# Patient Record
Sex: Female | Born: 1990 | Hispanic: Yes | Marital: Single | Smoking: Never smoker
Health system: Southern US, Community
[De-identification: ages and names within clinical notes are randomized; demographics above are authoritative.]

## PROBLEM LIST (undated history)

## (undated) ENCOUNTER — Inpatient Hospital Stay: Payer: Self-pay

## (undated) DIAGNOSIS — N6019 Diffuse cystic mastopathy of unspecified breast: Secondary | ICD-10-CM

## (undated) HISTORY — PX: NO PAST SURGERIES: SHX2092

---
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
## 2011-02-11 ENCOUNTER — Emergency Department: Payer: Self-pay | Admitting: Emergency Medicine

## 2011-12-09 ENCOUNTER — Observation Stay: Payer: Self-pay | Admitting: Unknown Physician Specialty

## 2011-12-14 ENCOUNTER — Observation Stay: Payer: Self-pay | Admitting: Obstetrics and Gynecology

## 2011-12-17 ENCOUNTER — Inpatient Hospital Stay: Payer: Self-pay | Admitting: Obstetrics and Gynecology

## 2011-12-17 LAB — CBC WITH DIFFERENTIAL/PLATELET
Eosinophil %: 0.2 %
HCT: 38.8 % (ref 35.0–47.0)
Lymphocyte #: 1.7 10*3/uL (ref 1.0–3.6)
MCHC: 34.6 g/dL (ref 32.0–36.0)
MCV: 95 fL (ref 80–100)
Monocyte #: 0.7 x10 3/mm (ref 0.2–0.9)
Monocyte %: 5.8 %
Neutrophil #: 9.1 10*3/uL — ABNORMAL HIGH (ref 1.4–6.5)
Neutrophil %: 79.5 %
Platelet: 140 10*3/uL — ABNORMAL LOW (ref 150–440)
RBC: 4.08 10*6/uL (ref 3.80–5.20)
RDW: 12.6 % (ref 11.5–14.5)
WBC: 11.5 10*3/uL — ABNORMAL HIGH (ref 3.6–11.0)

## 2011-12-19 LAB — HEMATOCRIT: HCT: 29.9 % — ABNORMAL LOW (ref 35.0–47.0)

## 2011-12-29 ENCOUNTER — Inpatient Hospital Stay (HOSPITAL_COMMUNITY): Admission: AD | Admit: 2011-12-29 | Payer: Self-pay | Source: Ambulatory Visit | Admitting: Family Medicine

## 2012-07-21 IMAGING — US US OB < 14 WEEKS - US OB TV
1 series · 17 of 28 positions shown · non-contrast
Comparison: none

REASON FOR EXAM: pregnant,vaginal bleeding
COMMENTS:

[Series 1: us ob < 14 weeks - us ob tv · 17 of 99 slices shown]
[im 1/99]
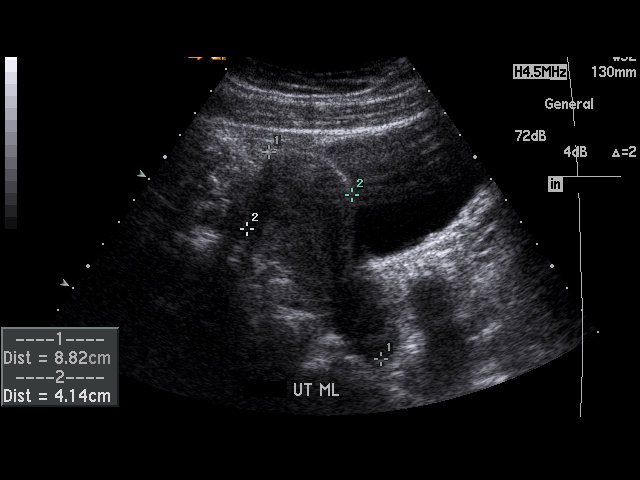
[im 8/99]
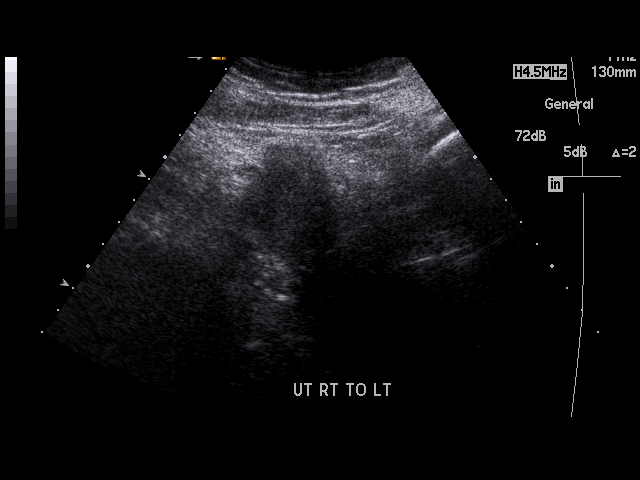
[im 15/99]
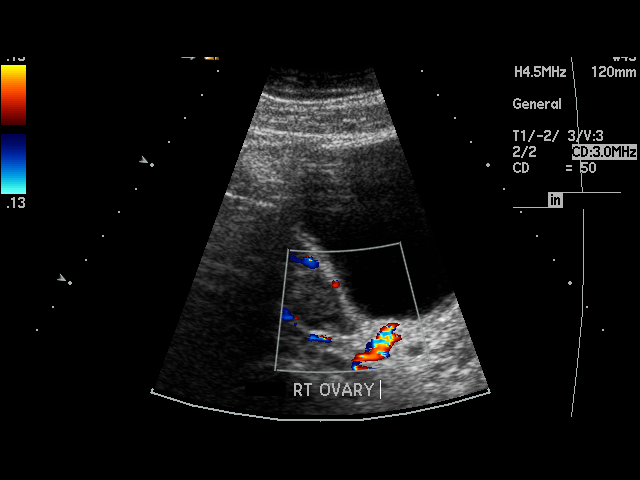
[im 19/99]
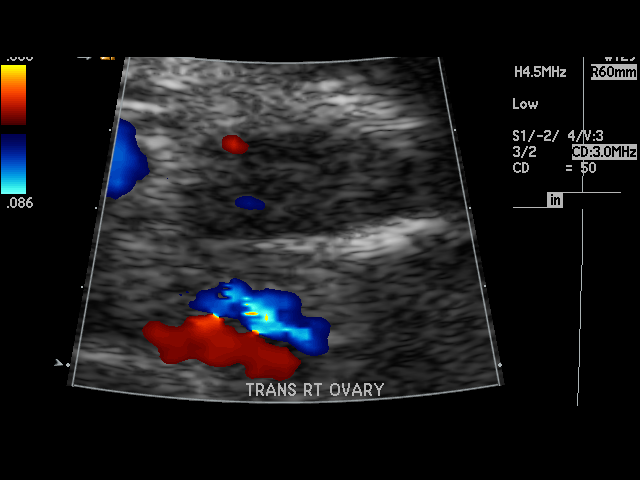
[im 26/99]
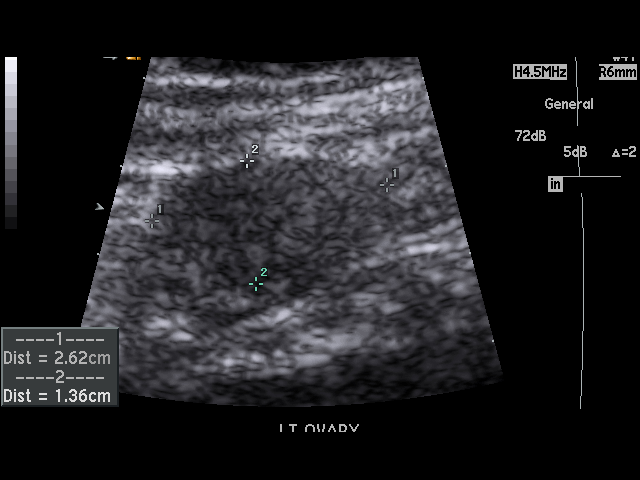
[im 33/99]
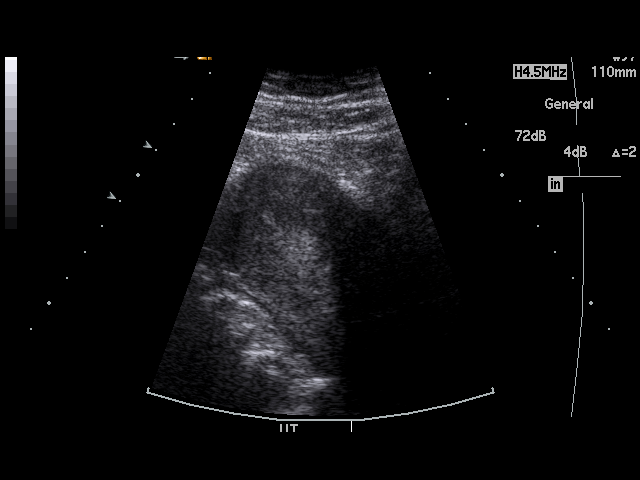
[im 37/99]
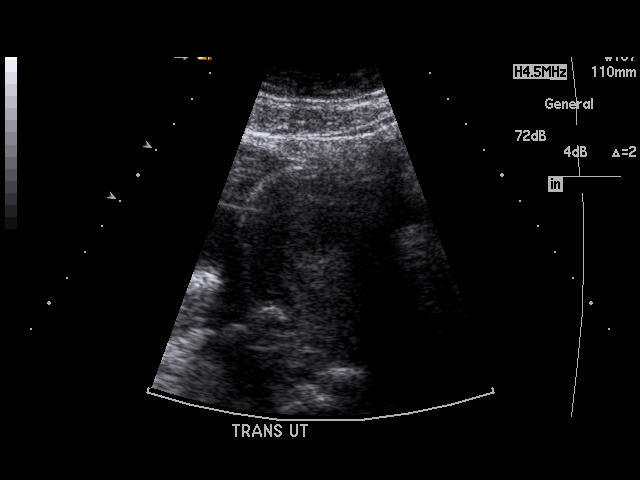
[im 44/99]
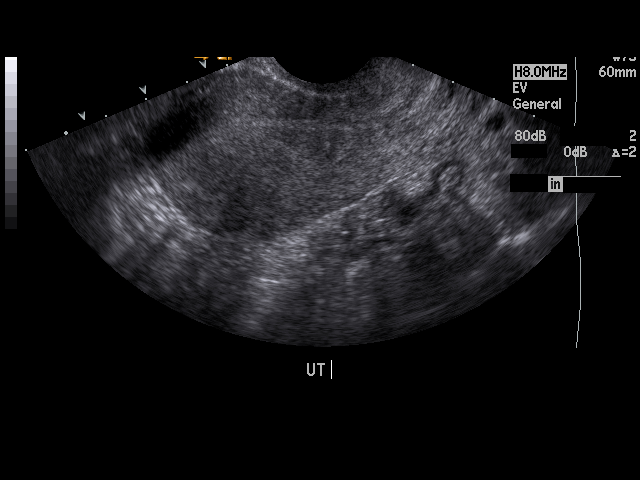
[im 51/99]
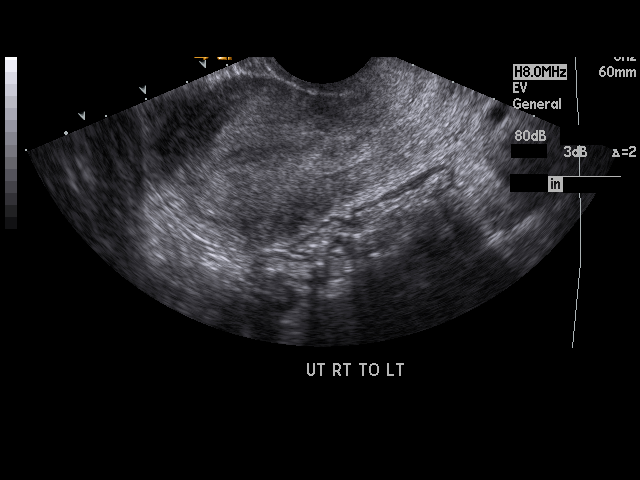
[im 55/99]
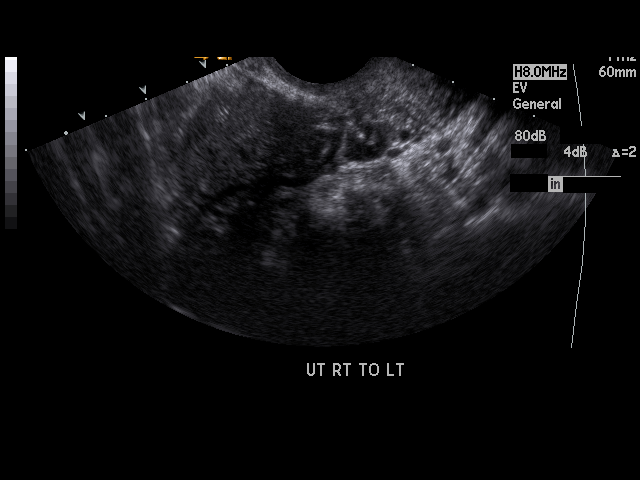
[im 62/99]
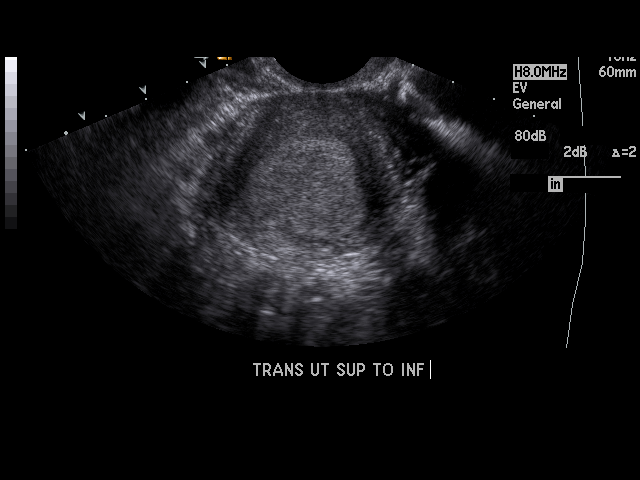
[im 66/99]
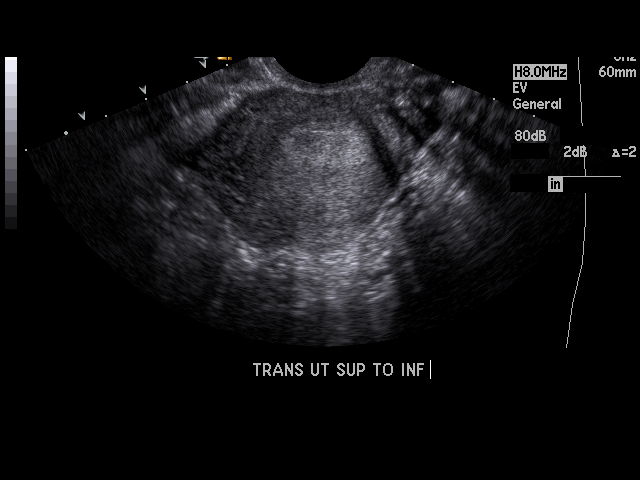
[im 73/99]
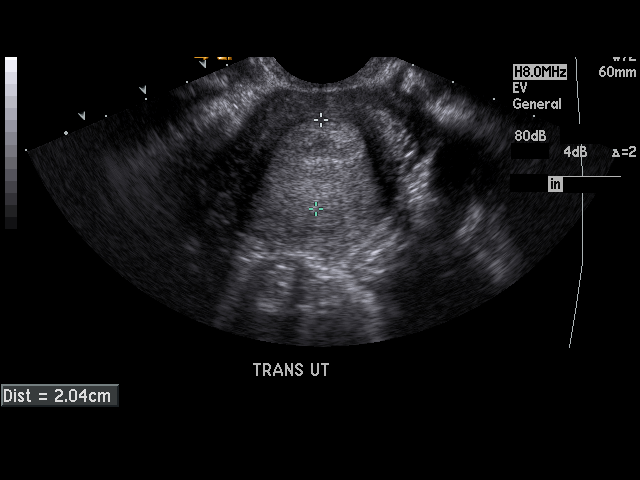
[im 80/99]
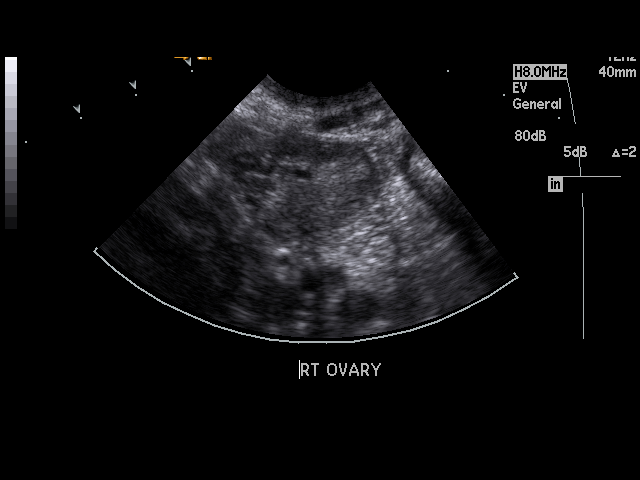
[im 84/99]
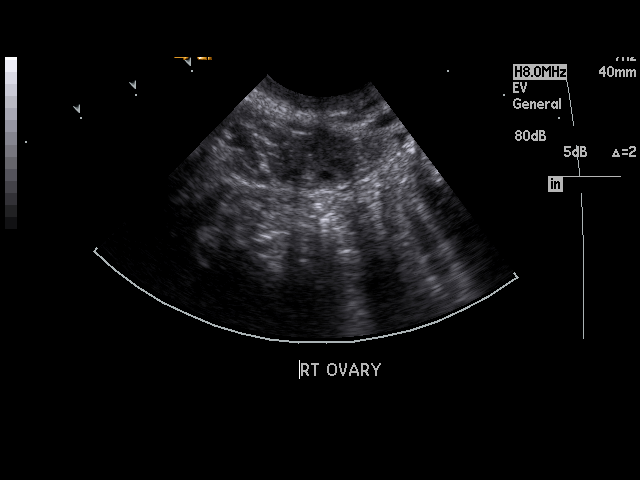
[im 91/99]
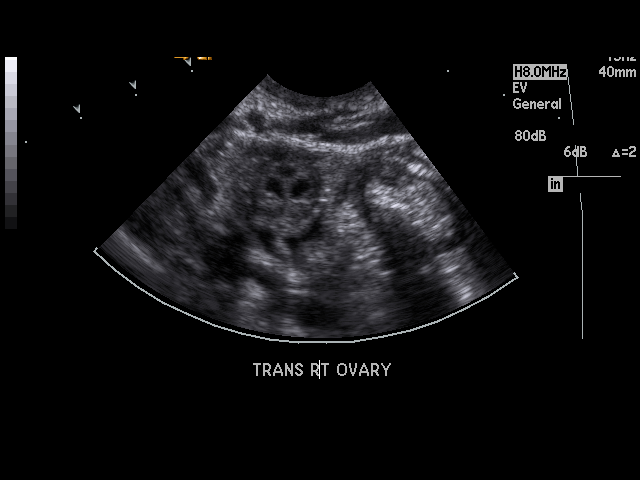
[im 99/99]
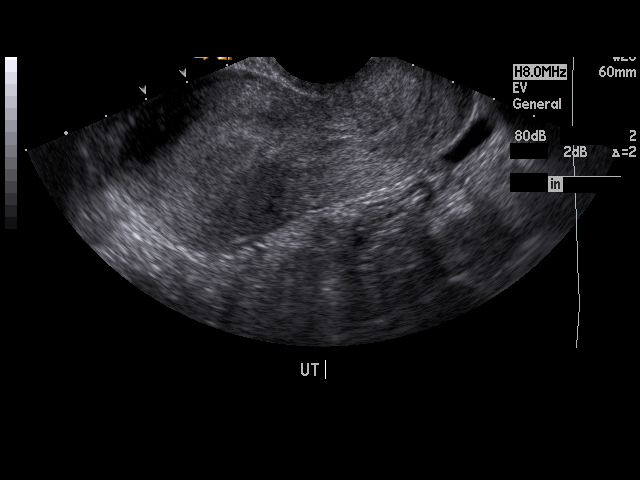

[17 of 28 positions shown; findings below may reference images not displayed]

PROCEDURE:     US  - US OB LESS THAN 14 WEEKS/W TRANS  - February 11, 2011  [DATE]

RESULT:     The uterus measures 8.8 x 4.1 x 4.5 cm. The endometrial stripe
is thickened at approximately 13 mm. No abnormal endometrial fluid
collections are demonstrated. No IUP is demonstrated. The right ovary
measures 2.2 x 1.4 x 1.8 cm. The left ovary measures 2.6 x 1.4 x 2.3 cm. No
IUP is demonstrated. The ovaries are normal in echotexture and size and do
not appear hypervascular.
IMPRESSION: 1. I do not see definite evidence of an IUP. The endometrial stripe is
thickened.
2. The ovaries and adnexal regions demonstrate no acute abnormality.

Correlation with patient's beta-hCG is needed. Serial ultrasound
examinations are available upon request.

## 2013-12-05 LAB — OB RESULTS CONSOLE HIV ANTIBODY (ROUTINE TESTING): HIV: NONREACTIVE

## 2013-12-05 LAB — OB RESULTS CONSOLE RPR: RPR: NONREACTIVE

## 2013-12-05 LAB — OB RESULTS CONSOLE VARICELLA ZOSTER ANTIBODY, IGG: Varicella: IMMUNE

## 2013-12-05 LAB — OB RESULTS CONSOLE HEPATITIS B SURFACE ANTIGEN: Hepatitis B Surface Ag: NEGATIVE

## 2013-12-05 LAB — OB RESULTS CONSOLE ABO/RH: RH TYPE: POSITIVE

## 2013-12-05 LAB — OB RESULTS CONSOLE ANTIBODY SCREEN: Antibody Screen: NEGATIVE

## 2013-12-05 LAB — OB RESULTS CONSOLE RUBELLA ANTIBODY, IGM: RUBELLA: IMMUNE

## 2014-02-20 NOTE — L&D Delivery Note (Signed)
Delivery Note At 11:40 AM a viable female was delivered via Vaginal, Spontaneous Delivery (Presentation: ; Occiput Anterior).  APGAR: 9, 9; weight: pending Placenta status: Intact, Spontaneous.  Cord: 3 vessels with the following complications: None.   Anesthesia: Epidural  Episiotomy: None Lacerations: None Suture Repair:none Est. Blood Loss (mL): 300 Baby name: Fredia Sorrowvelyn  Mom to postpartum.  Baby to Couplet care / Skin to Skin.  Heidi Hunt, Heidi Hunt 07/04/2014, 12:15 PM

## 2014-06-08 LAB — OB RESULTS CONSOLE GBS: GBS: POSITIVE

## 2014-06-08 LAB — OB RESULTS CONSOLE GC/CHLAMYDIA
Chlamydia: NEGATIVE
Gonorrhea: NEGATIVE

## 2014-06-23 ENCOUNTER — Inpatient Hospital Stay
Admission: EM | Admit: 2014-06-23 | Discharge: 2014-06-23 | DRG: 780 | Disposition: A | Discharge status: Home / Self Care | Payer: Medicaid Other | Attending: Obstetrics and Gynecology | Admitting: Obstetrics and Gynecology

## 2014-06-23 DIAGNOSIS — Z880 Allergy status to penicillin: Secondary | ICD-10-CM | POA: Diagnosis not present

## 2014-06-23 DIAGNOSIS — Z833 Family history of diabetes mellitus: Secondary | ICD-10-CM | POA: Diagnosis present

## 2014-06-23 DIAGNOSIS — O471 False labor at or after 37 completed weeks of gestation: Principal | ICD-10-CM | POA: Diagnosis present

## 2014-06-23 DIAGNOSIS — Z3A38 38 weeks gestation of pregnancy: Secondary | ICD-10-CM | POA: Diagnosis present

## 2014-06-23 DIAGNOSIS — Z79899 Other long term (current) drug therapy: Secondary | ICD-10-CM | POA: Diagnosis not present

## 2014-06-23 HISTORY — DX: Diffuse cystic mastopathy of unspecified breast: N60.19

## 2014-06-23 LAB — TYPE AND SCREEN
ABO/RH(D): O POS
Antibody Screen: NEGATIVE

## 2014-06-23 LAB — CBC
HCT: 34 % — ABNORMAL LOW (ref 35.0–47.0)
HEMOGLOBIN: 11.3 g/dL — AB (ref 12.0–16.0)
MCH: 29.6 pg (ref 26.0–34.0)
MCHC: 33.1 g/dL (ref 32.0–36.0)
MCV: 89.5 fL (ref 80.0–100.0)
Platelets: 132 10*3/uL — ABNORMAL LOW (ref 150–440)
RBC: 3.8 MIL/uL (ref 3.80–5.20)
RDW: 15.7 % — ABNORMAL HIGH (ref 11.5–14.5)
WBC: 10.2 10*3/uL (ref 3.6–11.0)

## 2014-06-23 MED ORDER — OXYTOCIN 40 UNITS IN LACTATED RINGERS INFUSION - SIMPLE MED
INTRAVENOUS | Status: AC
  Filled 2014-06-23: qty 1000

## 2014-06-23 MED ORDER — ONDANSETRON HCL 4 MG/2ML IJ SOLN: 4.0000 mg | Freq: Four times a day (QID) | INTRAMUSCULAR | Status: DC | PRN

## 2014-06-23 MED ORDER — CLINDAMYCIN PHOSPHATE 900 MG/50ML IV SOLN
INTRAVENOUS | Status: DC
Start: 2014-06-23 — End: 2014-06-23
  Filled 2014-06-23: qty 50

## 2014-06-23 MED ORDER — LACTATED RINGERS IV SOLN: 500.0000 mL | INTRAVENOUS | Status: DC | PRN

## 2014-06-23 MED ORDER — CLINDAMYCIN PHOSPHATE 900 MG/50ML IV SOLN
900.0000 mg | Freq: Three times a day (TID) | INTRAVENOUS | Status: DC
  Administered 2014-06-23: 900 mg via INTRAVENOUS

## 2014-06-23 MED ORDER — ACETAMINOPHEN 325 MG PO TABS: 650.0000 mg | ORAL_TABLET | ORAL | Status: DC | PRN

## 2014-06-23 MED ORDER — LACTATED RINGERS IV SOLN
INTRAVENOUS | Status: DC
  Administered 2014-06-23 (×2): via INTRAVENOUS

## 2014-06-23 MED ORDER — AMMONIA AROMATIC IN INHA
RESPIRATORY_TRACT | Status: AC
  Filled 2014-06-23: qty 10

## 2014-06-23 MED ORDER — LIDOCAINE HCL (PF) 1 % IJ SOLN
INTRAMUSCULAR | Status: AC
  Filled 2014-06-23: qty 30

## 2014-06-23 MED ORDER — LIDOCAINE HCL (PF) 1 % IJ SOLN: 30.0000 mL | INTRAMUSCULAR | Status: DC | PRN

## 2014-06-23 MED ORDER — OXYTOCIN BOLUS FROM INFUSION: 500.0000 mL | INTRAVENOUS | Status: DC

## 2014-06-23 MED ORDER — OXYTOCIN 40 UNITS IN LACTATED RINGERS INFUSION - SIMPLE MED: 62.5000 mL/h | INTRAVENOUS | Status: DC

## 2014-06-23 MED ORDER — OXYTOCIN 10 UNIT/ML IJ SOLN
INTRAMUSCULAR | Status: AC
  Filled 2014-06-23: qty 2

## 2014-06-23 MED ORDER — MISOPROSTOL 200 MCG PO TABS
ORAL_TABLET | ORAL | Status: AC
  Filled 2014-06-23: qty 4

## 2014-06-23 MED ORDER — OXYTOCIN 10 UNIT/ML IJ SOLN: 10.0000 [IU] | Freq: Once | INTRAMUSCULAR | Status: DC

## 2014-06-23 MED ORDER — CLINDAMYCIN PHOSPHATE 900 MG/50ML IV SOLN: 900.0000 mg | Freq: Three times a day (TID) | INTRAVENOUS | Status: DC

## 2014-06-23 MED ORDER — BUTORPHANOL TARTRATE 1 MG/ML IJ SOLN: 1.0000 mg | INTRAMUSCULAR | Status: DC | PRN

## 2014-06-23 NOTE — Progress Notes (Signed)
Subjective:  Contractions have spaced out.  Objective:   Vitals: Blood pressure 106/67, pulse 77, temperature 98.6 F (37 C), resp. rate 20, height 5\' 2"  (1.575 m), weight 78.472 kg (173 lb), last menstrual period 09/26/2013. General: NAD Abdomen: gravid, soft, non-tender Cervical Exam:  Dilation: 3.5 Effacement (%): 70 Cervical Position: Middle Station: -2 Presentation: Vertex Exam by::  (s greene,rn)  FHT: 130, mod, +accels, no decels Toco: q4710min irregular  Results for orders placed or performed during the hospital encounter of 06/23/14 (from the past 24 hour(s))  CBC     Status: Abnormal   Collection Time: 06/23/14 10:08 AM  Result Value Ref Range   WBC 10.2 3.6 - 11.0 K/uL   RBC 3.80 3.80 - 5.20 MIL/uL   Hemoglobin 11.3 (L) 12.0 - 16.0 g/dL   HCT 81.134.0 (L) 91.435.0 - 78.247.0 %   MCV 89.5 80.0 - 100.0 fL   MCH 29.6 26.0 - 34.0 pg   MCHC 33.1 32.0 - 36.0 g/dL   RDW 95.615.7 (H) 21.311.5 - 08.614.5 %   Platelets 132 (L) 150 - 440 K/uL  Type and screen     Status: None   Collection Time: 06/23/14 10:08 AM  Result Value Ref Range   ABO/RH(D) O POS    Antibody Screen NEG    Sample Expiration 06/26/2014   ABO/Rh     Status: None   Collection Time: 06/23/14 10:19 AM  Result Value Ref Range   ABO/RH(D) O POS     Assessment:   24 y.o. [id number] 4816w4d presenting with Braxton Hicks contractions  Plan:   1) Labor - no cervical change over the course of 8hrs, routine labor precautions discharge home  2) Fetus - cagegory I tracing  3) Discharge home, IOL for advanced cervical dilation 5/7

## 2014-06-23 NOTE — Plan of Care (Signed)
Problem: Consults Goal: Birthing Suites Patient Information Press F2 to bring up selections list Outcome: Progressing  Pt 37-[redacted] weeks EGA     

## 2014-06-23 NOTE — H&P (Signed)
Obstetric History and Physical  LYNNIE KOEHLER is a 24 y.o. G3P0011 with IUP at [redacted]w[redacted]d presenting for contractions that started last night. Patient states she has been having  regular, every 5 minutes contractions, none vaginal bleeding, intact membranes, with active fetal movement.    Prenatal Course Source of Care: [facility]  with onset of care at 10 weeks Pregnancy complications or risks: Patient Active Problem List   Diagnosis Date Noted  . Labor and delivery indication for care or intervention 06/23/2014    Prenatal labs and studies: ABO, Rh: O/Positive/-- (10/16 0000) Antibody: Negative (10/16 0000) Rubella: Immune (10/16 0000) Varicella: Immune RPR: Nonreactive (10/16 0000)  HBsAg: Negative (10/16 0000)  HIV: Non-reactive (10/16 0000)  ZOX:WRUEAVWU (04/18 0000)  Genetic screening normal Anatomy US normal Tdap: 04/27/14 Flu: not received    Past Medical History  Diagnosis Date  . Fibrocystic breast changes     Past Surgical History  Procedure Laterality Date  . No past surgeries      OB History  Gravida Para Term Preterm AB SAB TAB Ectopic Multiple Living  3 1  0 # Outcome Date GA Lbr Len/2nd Weight Sex Delivery Anes PTL Lv  3 Current           2 SAB           1 Para               History   Social History  . Marital Status: Single    Spouse Name: N/A  . Number of Children: N/A  . Years of Education: N/A   Social History Main Topics  . Smoking status: Never Smoker   . Smokeless tobacco: Not on file  . Alcohol Use: No  . Drug Use: No  . Sexual Activity: Yes    Birth Control/ Protection: None   Other Topics Concern  . None   Social History Narrative  . None    Family History  Problem Relation Age of Onset  . Diabetes Mother     Prescriptions prior to admission  Medication Sig Dispense Refill Last Dose  . Prenatal Vit-Fe Fumarate-FA (MULTIVITAMIN-PRENATAL) 27-0.8 MG TABS tablet Take 1 tablet by mouth daily at 12 noon.    06/22/2014 at Unknown time    Allergies  Allergen Reactions  . Penicillins Hives    Review of Systems: Negative except for what is mentioned in HPI.  Physical Exam: BP 115/63 mmHg  Pulse 64  Temp(Src) 98.7 F (37.1 C)  Resp 20  Ht  (1.575 m)  Wt 78.472 kg (173 lb)  BMI 31.63 kg/m2  LMP 09/26/2013 GENERAL: Well-developed, well-nourished female in no acute distress.  LUNGS: Clear to auscultation bilaterally.  HEART: Regular rate and rhythm. ABDOMEN: Soft, nontender, nondistended, gravid. EXTREMITIES: Nontender, no edema, 2+ distal pulses. Cervical Exam: Dilatation 3-4 cm   Effacement 70%   Station -2   Presentation: cephalic FHT:  Baseline rate 130s bpm   Variability moderate  Accelerations present   Decelerations none Contractions: Every 5 mins   Pertinent Labs/Studies:   Results for orders placed or performed during the hospital encounter of 06/23/14 (from the past 24 hour(s))  CBC     Status: Abnormal   Collection Time: 06/23/14 10:08 AM  Result Value Ref Range   WBC 10.2 3.6 - 11.0 K/uL   RBC 3.80 3.80 - 5.20 MIL/uL   Hemoglobin 11.3 (L) 12.0 - 16.0 g/dL   HCT 98.1 (L)  35.0 - 47.0 %   MCV 89.5 80.0 - 100.0 fL   MCH 29.6 26.0 - 34.0 pg   MCHC 33.1 32.0 - 36.0 g/dL   RDW 04.515.7 (H) 40.911.5 - 81.114.5 %   Platelets 132 (L) 150 - 440 K/uL    Assessment : Synetta FailYesenia E Ullom is a 24 y.o. G3P0011 at 1831w4d being admitted for labor.  Plan: Labor: Expectant management.  Induction/Augmentation as needed, per protocol FWB: Reassuring fetal heart tracing.  GBS positive- abx started  Delivery plan: Hopeful for vaginal delivery  Jannet Mantisourtney Natalija Mavis, CNM [facility]

## 2014-06-24 LAB — RPR: RPR: NONREACTIVE

## 2014-06-24 LAB — HIV ANTIBODY (ROUTINE TESTING W REFLEX): HIV Screen 4th Generation wRfx: NONREACTIVE

## 2014-06-27 ENCOUNTER — Telehealth: Payer: Self-pay | Admitting: *Deleted

## 2014-06-27 NOTE — Telephone Encounter (Signed)
Called to see if she was still planning on coming in for induction. No answer. Scheduled for 0800 this morning.

## 2014-06-30 NOTE — H&P (Signed)
L&D Evaluation:  History:   HPI 24 yo G2P0010 at 2974w1d [facility] presenting with contractions starting yesterday evening increasing in intensity and spacing.  +FM, no LOF, no VB.  Was noted to be 1cm dilated at last check in clinic.  Her pregnnacy is noteable for low lying placenta on 20 week antomy scan resolved at follow up scan 28 weeks.   She is also being followed for gestational thrombocyotopenia with last platelet count 36k at 35 weeks.  Received influenza vaccination this pregnnacy no records on TDap    Presents with contractions    Patient's Medical History No Chronic Illness    Patient's Surgical History none    Medications Pre Natal Vitamins    Allergies PCN    Social History none    Family History Non-Contributory   ROS:   ROS All systems were reviewed.  HEENT, CNS, GI, GU, Respiratory, CV, Renal and Musculoskeletal systems were found to be normal.   Exam:   Vital Signs stable    General no apparent distress    Mental Status no increased work of breathing    Abdomen gravid, non-tender    Estimated Fetal Weight Average for gestational age    Fetal Position VTX    Edema no edema    Reflexes 1+    Clonus negative    Pelvic 1/50/-2    FHT normal rate with no decels    Ucx irregular, 2-7010min    Skin no lesions   Impression:   Impression Early labor vs braxton hick contractions at 6674w1d   Plan:   Plan EFM/NST, monitor contractions and for cervical change   Electronic Signatures: Lorrene ReidStaebler, Marsden Zaino M (MD)  (Signed 19-Oct-13 12:58)  Authored: L&D Evaluation   Last Updated: 19-Oct-13 12:58 by Lorrene ReidStaebler, Peng Thorstenson M (MD)

## 2014-06-30 NOTE — H&P (Signed)
Hunt&D Evaluation:  History:   HPI 24 yo G2P0010 [facility] 10wk 4 d ultrasound presents with onset strong regular contractions at 0600 this AM. Pain with contractions in her back and rates her pain at 6/10. +FM +bloody show with ?leakage of water. Was noted to be 1cm dilated at last check in clinic.  Her pregnnacy is noteable for low lying placenta on 20 week antomy scan resolved at follow up scan 28 weeks.   She is also being followed for gestational thrombocyotopenia with last platelet count 136k at 35 weeks.  Received influenza vaccination this pregnacy. LABS: O POS, RI, VI, GBS negative    Presents with contractions    Patient's Medical History No Chronic Illness    Patient's Surgical History none    Medications Pre Natal Vitamins    Allergies PCN    Social History none    Family History Non-Contributory   ROS:   ROS see HPI   Exam:   Vital Signs stable  119/67    Urine Protein not completed    General breathing with contractions    Mental Status clear    Chest clear    Heart normal sinus rhythm, no murmur/gallop/rubs    Abdomen gravid, tender with contractions    Estimated Fetal Weight Average for gestational age    Fetal Position ?OP    Edema no edema    Reflexes 2+    Pelvic no external lesions, 4/75%/-2/LOP? Nitazine negative. BOW palpated    Mebranes Intact    FHT normal rate with no decels, Cat 1-baseline 135 with accels to 150s+    FHT Description mod variability    Ucx regular, q2-4, palpate mod    Skin dry   Impression:   Impression IUP at 38 2/7 weeks in labor   Plan:   Plan EFM/NST, monitor contractions and for cervical change   Electronic Signatures: Heidi Hunt, Heidi Hunt (CNM)  (Signed 27-Oct-13 13:26)  Authored: Hunt&D Evaluation   Last Updated: 27-Oct-13 13:26 by Heidi Hunt, Alaster Asfaw Hunt (CNM)

## 2014-06-30 NOTE — H&P (Signed)
L&D Evaluation:  History:   HPI 24 yo G2P0010 [facility] 1471w6d [facility] presenting with decreased fetal movement.  +FM since arrival to L&D, no LOF, no VB, no contractions.  Was noted to be 1cm dilated at last check in clinic.  Her pregnnacy is noteable for low lying placenta on 20 week antomy scan resolved at follow up scan 28 weeks.   She is also being followed for gestational thrombocyotopenia with last platelet count 36k at 35 weeks.  Received influenza vaccination this pregnnacy no records on TDap    Presents with decreased fetal movement    Patient's Medical History No Chronic Illness    Patient's Surgical History none    Medications Pre Natal Vitamins    Allergies PCN    Social History none    Family History Non-Contributory   ROS:   ROS All systems were reviewed.  HEENT, CNS, GI, GU, Respiratory, CV, Renal and Musculoskeletal systems were found to be normal.   Exam:   Vital Signs stable    General no apparent distress    Mental Status no increased work of breathing    Abdomen gravid, non-tender    Estimated Fetal Weight Average for gestational age    Fetal Position VTX    Edema no edema    Reflexes 1+    Clonus negative    Pelvic 1/50/-2    FHT normal rate with no decels, reactive category I tracing    Ucx absent    Skin no lesions   Impression:   Impression decreased fetal movement, Early labor vs braxton hick contractions at 2775w1d   Plan:   Plan EFM/NST    Comments - Reactive tracing and good fetal movement while here on L&D.  Given routine labor precautions has follow up in place with myself next week    Follow Up Appointment already scheduled   Electronic Signatures: Lorrene ReidStaebler, Shaterra Sanzone M (MD)  (Signed 27-Oct-13 21:30)  Authored: L&D Evaluation   Last Updated: 27-Oct-13 21:30 by Lorrene ReidStaebler, Jaydence Vanyo M (MD)

## 2014-07-04 ENCOUNTER — Inpatient Hospital Stay: Payer: Medicaid Other | Admitting: Anesthesiology

## 2014-07-04 ENCOUNTER — Encounter: Payer: Self-pay | Admitting: Anesthesiology

## 2014-07-04 ENCOUNTER — Inpatient Hospital Stay
Admission: EM | Admit: 2014-07-04 | Discharge: 2014-07-05 | DRG: 775 | Disposition: A | Discharge status: Home / Self Care | Payer: Medicaid Other | Attending: Obstetrics and Gynecology | Admitting: Obstetrics and Gynecology

## 2014-07-04 ENCOUNTER — Encounter: Payer: Self-pay | Admitting: Advanced Practice Midwife

## 2014-07-04 DIAGNOSIS — Z3A40 40 weeks gestation of pregnancy: Secondary | ICD-10-CM | POA: Diagnosis present

## 2014-07-04 DIAGNOSIS — O99824 Streptococcus B carrier state complicating childbirth: Principal | ICD-10-CM | POA: Diagnosis present

## 2014-07-04 DIAGNOSIS — Z880 Allergy status to penicillin: Secondary | ICD-10-CM

## 2014-07-04 DIAGNOSIS — Z3483 Encounter for supervision of other normal pregnancy, third trimester: Secondary | ICD-10-CM | POA: Diagnosis present

## 2014-07-04 LAB — CBC
HCT: 36 % (ref 35.0–47.0)
HEMOGLOBIN: 12.1 g/dL (ref 12.0–16.0)
MCH: 29.6 pg (ref 26.0–34.0)
MCHC: 33.5 g/dL (ref 32.0–36.0)
MCV: 88.4 fL (ref 80.0–100.0)
Platelets: 147 10*3/uL — ABNORMAL LOW (ref 150–440)
RBC: 4.07 MIL/uL (ref 3.80–5.20)
RDW: 15.5 % — AB (ref 11.5–14.5)
WBC: 9 10*3/uL (ref 3.6–11.0)

## 2014-07-04 LAB — TYPE AND SCREEN
ABO/RH(D): O POS
ANTIBODY SCREEN: NEGATIVE

## 2014-07-04 MED ORDER — OXYTOCIN 40 UNITS IN LACTATED RINGERS INFUSION - SIMPLE MED
INTRAVENOUS | Status: AC
  Filled 2014-07-04: qty 1000

## 2014-07-04 MED ORDER — IBUPROFEN 600 MG PO TABS
600.0000 mg | ORAL_TABLET | Freq: Four times a day (QID) | ORAL | Status: DC
  Administered 2014-07-04 – 2014-07-05 (×3): 600 mg via ORAL
  Filled 2014-07-04 (×4): qty 1

## 2014-07-04 MED ORDER — ONDANSETRON HCL 4 MG/2ML IJ SOLN: 4.0000 mg | Freq: Four times a day (QID) | INTRAMUSCULAR | Status: DC | PRN

## 2014-07-04 MED ORDER — LIDOCAINE-EPINEPHRINE (PF) 1.5 %-1:200000 IJ SOLN
INTRAMUSCULAR | Status: AC | PRN
  Administered 2014-07-04: 4 mL

## 2014-07-04 MED ORDER — LIDOCAINE HCL (PF) 1 % IJ SOLN
INTRAMUSCULAR | Status: AC
  Filled 2014-07-04: qty 30

## 2014-07-04 MED ORDER — HYDROCODONE-ACETAMINOPHEN 5-325 MG PO TABS
1.0000 | ORAL_TABLET | Freq: Four times a day (QID) | ORAL | Status: DC | PRN
  Administered 2014-07-04 – 2014-07-05 (×3): 1 via ORAL
  Filled 2014-07-04 (×3): qty 1

## 2014-07-04 MED ORDER — DOCUSATE SODIUM 100 MG PO CAPS: 100.0000 mg | ORAL_CAPSULE | Freq: Two times a day (BID) | ORAL | Status: DC | PRN

## 2014-07-04 MED ORDER — OXYTOCIN 40 UNITS IN LACTATED RINGERS INFUSION - SIMPLE MED: 62.5000 mL/h | INTRAVENOUS | Status: DC

## 2014-07-04 MED ORDER — LACTATED RINGERS IV SOLN: 500.0000 mL | INTRAVENOUS | Status: DC | PRN

## 2014-07-04 MED ORDER — ACETAMINOPHEN 325 MG PO TABS: 650.0000 mg | ORAL_TABLET | ORAL | Status: DC | PRN

## 2014-07-04 MED ORDER — OXYTOCIN BOLUS FROM INFUSION
500.0000 mL | INTRAVENOUS | Status: DC
Start: 2014-07-04 — End: 2014-07-04

## 2014-07-04 MED ORDER — FERROUS SULFATE 325 (65 FE) MG PO TABS
325.0000 mg | ORAL_TABLET | Freq: Every day | ORAL | Status: DC
  Administered 2014-07-05: 325 mg via ORAL
  Filled 2014-07-04: qty 1

## 2014-07-04 MED ORDER — BUPIVACAINE HCL (PF) 0.25 % IJ SOLN
INTRAMUSCULAR | Status: AC | PRN
  Administered 2014-07-04: 10 mL

## 2014-07-04 MED ORDER — ONDANSETRON HCL 4 MG PO TABS
4.0000 mg | ORAL_TABLET | ORAL | Status: DC | PRN
  Filled 2014-07-04: qty 1

## 2014-07-04 MED ORDER — BENZOCAINE-MENTHOL 20-0.5 % EX AERO: 1.0000 "application " | INHALATION_SPRAY | CUTANEOUS | Status: DC | PRN

## 2014-07-04 MED ORDER — DIBUCAINE 1 % RE OINT: 1.0000 "application " | TOPICAL_OINTMENT | RECTAL | Status: DC | PRN

## 2014-07-04 MED ORDER — OXYTOCIN 10 UNIT/ML IJ SOLN
10.0000 [IU] | Freq: Once | INTRAMUSCULAR | Status: DC
Start: 2014-07-04 — End: 2014-07-04

## 2014-07-04 MED ORDER — CEFAZOLIN SODIUM-DEXTROSE 2-3 GM-% IV SOLR: 2.0000 g | Freq: Once | INTRAVENOUS | Status: DC

## 2014-07-04 MED ORDER — SIMETHICONE 80 MG PO CHEW
80.0000 mg | CHEWABLE_TABLET | ORAL | Status: DC | PRN
  Filled 2014-07-04 (×2): qty 1

## 2014-07-04 MED ORDER — AMMONIA AROMATIC IN INHA
RESPIRATORY_TRACT | Status: AC
  Filled 2014-07-04: qty 10

## 2014-07-04 MED ORDER — LIDOCAINE HCL (PF) 1 % IJ SOLN: 30.0000 mL | INTRAMUSCULAR | Status: DC | PRN

## 2014-07-04 MED ORDER — ONDANSETRON HCL 4 MG/2ML IJ SOLN: 4.0000 mg | INTRAMUSCULAR | Status: DC | PRN

## 2014-07-04 MED ORDER — MISOPROSTOL 200 MCG PO TABS
ORAL_TABLET | ORAL | Status: AC
  Filled 2014-07-04: qty 4

## 2014-07-04 MED ORDER — CITRIC ACID-SODIUM CITRATE 334-500 MG/5ML PO SOLN: 30.0000 mL | ORAL | Status: DC | PRN

## 2014-07-04 MED ORDER — CEFAZOLIN SODIUM-DEXTROSE 2-3 GM-% IV SOLR
INTRAVENOUS | Status: AC
  Administered 2014-07-04: 06:00:00
  Filled 2014-07-04: qty 50

## 2014-07-04 MED ORDER — ZOLPIDEM TARTRATE 5 MG PO TABS
5.0000 mg | ORAL_TABLET | Freq: Every evening | ORAL | Status: DC | PRN
Start: 2014-07-04 — End: 2014-07-05

## 2014-07-04 MED ORDER — PRENATAL 27-0.8 MG PO TABS
1.0000 | ORAL_TABLET | Freq: Every day | ORAL | Status: DC
  Administered 2014-07-04: 1 via ORAL
  Filled 2014-07-04 (×3): qty 1

## 2014-07-04 MED ORDER — LACTATED RINGERS IV SOLN: INTRAVENOUS | Status: DC

## 2014-07-04 MED ORDER — LIDOCAINE-EPINEPHRINE (PF) 1.5 %-1:200000 IJ SOLN: INTRAMUSCULAR | Status: DC | PRN

## 2014-07-04 MED ORDER — FENTANYL 2.5 MCG/ML W/ROPIVACAINE 0.2% IN NS 100 ML EPIDURAL INFUSION (ARMC-ANES)
EPIDURAL | Status: AC
  Administered 2014-07-04: 10 mL/h via EPIDURAL
  Filled 2014-07-04: qty 100

## 2014-07-04 MED ORDER — CEFAZOLIN SODIUM 1-5 GM-% IV SOLN: 1.0000 g | Freq: Three times a day (TID) | INTRAVENOUS | Status: DC

## 2014-07-04 MED ORDER — LANOLIN HYDROUS EX OINT: TOPICAL_OINTMENT | CUTANEOUS | Status: DC | PRN

## 2014-07-04 MED ORDER — DIPHENHYDRAMINE HCL 25 MG PO CAPS: 25.0000 mg | ORAL_CAPSULE | Freq: Four times a day (QID) | ORAL | Status: DC | PRN

## 2014-07-04 MED ORDER — LACTATED RINGERS IV SOLN
INTRAVENOUS | Status: DC
  Administered 2014-07-04 (×2): via INTRAVENOUS

## 2014-07-04 MED ORDER — WITCH HAZEL-GLYCERIN EX PADS: 1.0000 "application " | MEDICATED_PAD | CUTANEOUS | Status: DC | PRN

## 2014-07-04 NOTE — H&P (Signed)
OB History & Physical   History of Present Illness:  Chief Complaint: Contractions  HPI:  Heidi FailYesenia E Lager is a 24 y.o. 633P0011 female at 7728w1d dated by 9 week ultrasound.  Her pregnancy has been uncomplicated.  She presents to L&D for evaluation of contractions that started several hours ago.  They are now coming frequently and more painfully.   She denies Leakage of fluid.   She denies Vaginal Bleeding.   She notes normal Fetal movement.    Maternal Medical History:   Past Medical History  Diagnosis Date  . Fibrocystic breast changes     Past Surgical History  Procedure Laterality Date  . No past surgeries      Allergies  Allergen Reactions  . Penicillins Hives    Prior to Admission medications   Medication Sig Start Date End Date Taking? Authorizing Provider  Prenatal Vit-Fe Fumarate-FA (MULTIVITAMIN-PRENATAL) 27-0.8 MG TABS tablet Take 1 tablet by mouth daily at 12 noon.   Yes Historical Provider, MD    OB History  Gravida Para Term Preterm AB SAB TAB Ectopic Multiple Living  3 1  0 1 1    1     # Outcome Date GA Lbr Len/2nd Weight Sex Delivery Anes PTL Lv  3 Current           2 SAB           1 Para               Prenatal care site: [facility]  Social History: She  reports that she has never smoked. She does not have any smokeless tobacco history on file. She reports that she does not drink alcohol or use illicit drugs.  Family History: family history includes Diabetes in her mother.   Review of Systems: Negative x 10 systems reviewed except as noted in the HPI.     Physical Exam:  Vital Signs:  General: no acute distress.  HEENT: normocephalic, atraumatic Heart: regular rate & rhythm.  No murmurs/rubs/gallops Lungs: clear to auscultation bilaterally Abdomen: soft, gravid, non-tender;  EFW: 7 pounds Pelvic:   External: Normal external female genitalia  Cervix: Dilation: 6 / Effacement (%): 70 / Station: -2  Extremities: non-tender, symmetric, no  edema bilaterally.  DTRs: 2+  Neurologic: Alert & oriented x 3.    Pertinent Results:  Prenatal Labs: Blood type/Rh O positive  Antibody screen negative  Rubella immune  RPR Non-reactive  HBsAg negative  HIV negative  GC neg  Chlamydia neg  Genetic screening 1st trimester negative  1 hour GTT 87  3 hour GTT n/a  GBS positive   Baseline FHR: 135 beats    Variability: moderate    Accelerations: absent    Decelerations: absent Contractions: preesent frequency: 3-4 q 10 min Overall assessment: category 1, negative CST   Assessment:  Heidi Hunt is a 24 y.o. 373P0011 female at 3828w1d with labor.   Plan:  1. Admit to Labor & Delivery - notify attending & anesthesia.   2. CBC, T&S, Clrs, IVF 3. GBS positive, PCN allergy though mild- Ancef.   4. Total weight gain this pregnancy is 31 pounds 5. dispo - home postpartum  Conard NovakStephen D. Rosalind Guido, MD, FACOG 07/04/2014 5:50 AM

## 2014-07-04 NOTE — Anesthesia Preprocedure Evaluation (Signed)
Anesthesia Evaluation  Patient identified by MRN, date of birth, ID band Patient awake    Reviewed: Allergy & Precautions, NPO status , Patient's Chart, lab work & pertinent test results  Airway        Dental   Pulmonary neg pulmonary ROS,          Cardiovascular negative cardio ROS      Neuro/Psych negative neurological ROS  negative psych ROS   GI/Hepatic negative GI ROS, Neg liver ROS,   Endo/Other  negative endocrine ROS  Renal/GU negative Renal ROS  negative genitourinary   Musculoskeletal negative musculoskeletal ROS (+)   Abdominal   Peds negative pediatric ROS (+)  Hematology negative hematology ROS (+)   Anesthesia Other Findings   Reproductive/Obstetrics (+) Pregnancy                             Anesthesia Physical Anesthesia Plan  ASA: II  Anesthesia Plan: Epidural   Post-op Pain Management:    Induction:   Airway Management Planned: Nasal Cannula  Additional Equipment:   Intra-op Plan:   Post-operative Plan:   Informed Consent: I have reviewed the patients History and Physical, chart, labs and discussed the procedure including the risks, benefits and alternatives for the proposed anesthesia with the patient or authorized representative who has indicated his/her understanding and acceptance.   Dental advisory given  Plan Discussed with: Surgeon  Anesthesia Plan Comments:         Anesthesia Quick Evaluation

## 2014-07-04 NOTE — Progress Notes (Signed)
L&D Note  07/04/2014 - 10:35 AM  24 y.o. [id number] 7727w1d.   [name] is admitted for labor   Subjective:  Comfortable with epidural  Objective:   Filed Vitals:   07/04/14 0740 07/04/14 0744 07/04/14 0745 07/04/14 0816  BP:  90/43  112/50  Pulse:  70  73  Temp:      TempSrc:      Resp:    20  SpO2: 100%  100%     Current Vital Signs 24h Vital Sign Ranges  T 98 F (36.7 C) Temp  Avg: 98 F (36.7 C)  Min: 98 F (36.7 C)  Max: 98 F (36.7 C)  BP (!) 112/50 mmHg BP  Min: 88/41  Max: 112/50  HR 73 Pulse  Avg: 74.6  Min: 70  Max: 88  RR 20 (20) Resp  Avg: 20  Min: 20  Max: 20  SaO2 100 %   SpO2  Avg: 99.9 %  Min: 99 %  Max: 100 %       24 Hour I/O Current Shift I/O  Time Ins Outs        FHR: category 1 Toco: regular SVE: 9/100/-1   Assessment :  IUP at term, labor    Plan:  AROM with clear fluid noted. Anticipate delivery soon  Marta AntuBrothers, Jesicca Dipierro, [place]

## 2014-07-04 NOTE — Progress Notes (Signed)
L&D Note  07/04/2014 - 8:38 AM  24 y.o. G3P1011 2874w1d .   [name] is admitted for labor   Subjective:  Comfortable after epidural, denies feeling pressure  Objective:   Filed Vitals:   07/04/14 0740 07/04/14 0744 07/04/14 0745 07/04/14 0816  BP:  90/43  112/50  Pulse:  70  73  Temp:      TempSrc:      Resp:    20  SpO2: 100%  100%     Current Vital Signs 24h Vital Sign Ranges  T 98 F (36.7 C) Temp  Avg: 98 F (36.7 C)  Min: 98 F (36.7 C)  Max: 98 F (36.7 C)  BP (!) 112/50 mmHg BP  Min: 88/41  Max: 112/50  HR 73 Pulse  Avg: 74.6  Min: 70  Max: 88  RR 20 (20) Resp  Avg: 20  Min: 20  Max: 20  SaO2 100 %   SpO2  Avg: 99.9 %  Min: 99 %  Max: 100 %       24 Hour I/O Current Shift I/O  Time Ins Outs        FHR: Cat 1, FHR 145, mod variability, + accels Toco: q q 3-5 min SVE: 9/100/-1   Assessment :  IUP at 6074w1d in active labor    Plan:  Defer AROM until antibiotics in x 4 hours (GBS+, PCN allergy, Ancef given at 0600) Anticipate vaginal delivery  Heidi Hunt, Heidi Hunt, [place]

## 2014-07-04 NOTE — Anesthesia Procedure Notes (Addendum)
Epidural Patient location during procedure: OB Start time: 07/04/2014 7:05 AM End time: 07/04/2014 7:06 AM  Staffing Anesthesiologist: Yves DillARROLL, Purvi Ruehl  Preanesthetic Checklist Completed: patient identified, site marked, pre-op evaluation, timeout performed, IV checked, risks and benefits discussed and monitors and equipment checked  Epidural Patient position: sitting Prep: Betadine Patient monitoring: heart rate, cardiac monitor, continuous pulse ox and blood pressure Approach: left paramedian Location: L3-L4 Injection technique: LOR air  Needle:  Needle type: Tuohy  Needle gauge: 18 G Needle length: 9 cm Catheter size: 20 Guage Catheter at skin depth: 9 cm  Assessment Sensory level: T8  Additional Notes First attempt with epidural easy, but threaded into vein.  The second pass redirected  With good loss of resistance and clean thread of catheter with a negative test dose.

## 2014-07-05 LAB — HEMATOCRIT: HEMATOCRIT: 32 % — AB (ref 35.0–47.0)

## 2014-07-05 MED ORDER — IBUPROFEN 600 MG PO TABS: 600.0000 mg | ORAL_TABLET | Freq: Four times a day (QID) | ORAL | Status: DC

## 2014-07-05 MED ORDER — HYDROCODONE-ACETAMINOPHEN 5-325 MG PO TABS: 1.0000 | ORAL_TABLET | Freq: Four times a day (QID) | ORAL | Status: DC | PRN

## 2014-07-05 NOTE — Discharge Instructions (Signed)
Discharge instructions:   Call office if you have any of the following: headache, visual changes, fever >100 F, chills, breast concerns, excessive vaginal bleeding, incision drainage or problems, leg pain or redness, depression or any other concerns.   Activity: Do not lift > 10 lbs for 6 weeks.  No intercourse or tampons for 6 weeks.  No driving for 1-2 weeks.   Vaginal Delivery, Care After Refer to this sheet in the next few weeks. These discharge instructions provide you with information on caring for yourself after delivery. Your caregiver may also give you specific instructions. Your treatment has been planned according to the most current medical practices available, but problems sometimes occur. Call your caregiver if you have any problems or questions after you go home. HOME CARE INSTRUCTIONS  Take over-the-counter or prescription medicines only as directed by your caregiver or pharmacist.  Do not drink alcohol, especially if you are breastfeeding or taking medicine to relieve pain.  Do not chew or smoke tobacco.  Do not use illegal drugs.  Continue to use good perineal care. Good perineal care includes:  Wiping your perineum from front to back.  Keeping your perineum clean.  Do not use tampons or douche until your caregiver says it is okay.  Shower, wash your hair, and take tub baths as directed by your caregiver.  Wear a well-fitting bra that provides breast support.  Eat healthy foods.  Drink enough fluids to keep your urine clear or pale yellow.  Eat high-fiber foods such as whole grain cereals and breads, brown rice, beans, and fresh fruits and vegetables every day. These foods may help prevent or relieve constipation.  Follow your caregiver's recommendations regarding resumption of activities such as climbing stairs, driving, lifting, exercising, or traveling.  Talk to your caregiver about resuming sexual activities. Resumption of sexual activities is dependent  upon your risk of infection, your rate of healing, and your comfort and desire to resume sexual activity.  Try to have someone help you with your household activities and your newborn for at least a few days after you leave the hospital.  Rest as much as possible. Try to rest or take a nap when your newborn is sleeping.  Increase your activities gradually.  Keep all of your scheduled postpartum appointments. It is very important to keep your scheduled follow-up appointments. At these appointments, your caregiver will be checking to make sure that you are healing physically and emotionally. SEEK MEDICAL CARE IF:   You are passing large clots from your vagina. Save any clots to show your caregiver.  You have a foul smelling discharge from your vagina.  You have trouble urinating.  You are urinating frequently.  You have pain when you urinate.  You have a change in your bowel movements.  You have increasing redness, pain, or swelling near your vaginal incision (episiotomy) or vaginal tear.  You have pus draining from your episiotomy or vaginal tear.  Your episiotomy or vaginal tear is separating.  You have painful, hard, or reddened breasts.  You have a severe headache.  You have blurred vision or see spots.  You feel sad or depressed.  You have thoughts of hurting yourself or your newborn.  You have questions about your care, the care of your newborn, or medicines.  You are dizzy or light-headed.  You have a rash.  You have nausea or vomiting.  You were breastfeeding and have not had a menstrual period within 12 weeks after you stopped breastfeeding.  You  are not breastfeeding and have not had a menstrual period by the 12th week after delivery.  You have a fever. SEEK IMMEDIATE MEDICAL CARE IF:   You have persistent pain.  You have chest pain.  You have shortness of breath.  You faint.  You have leg pain.  You have stomach pain.  Your vaginal bleeding  saturates two or more sanitary pads in 1 hour. MAKE SURE YOU:   Understand these instructions.  Will watch your condition.  Will get help right away if you are not doing well or get worse. Document Released: 02/04/2000 Document Revised: 06/23/2013 Document Reviewed: 10/04/2011 [facility] Patient Information 2015 [place] [place]. This information is not intended to replace advice given to you by your health care provider. Make sure you discuss any questions you have with your health care provider.

## 2014-07-05 NOTE — Progress Notes (Signed)
Pt discharged home with infant.  Discharge instructions and follow up appointment given to and reviewed with pt.  Pt verbalized understanding.  Escorted by nursing.

## 2014-07-05 NOTE — Discharge Summary (Signed)
  Obstetric Discharge Summary Reason for Admission: onset of labor Intrapartum Procedures: spontaneous vaginal delivery Postpartum Procedures: none Complications-Operative and Postpartum: none HEMOGLOBIN  Date Value Ref Range Status  07/04/2014 12.1 12.0 - 16.0 g/dL Final   HGB  Date Value Ref Range Status  12/17/2011 13.4 12.0-16.0 g/dL Final   HCT  Date Value Ref Range Status  07/05/2014 32.0* 35.0 - 47.0 % Final  12/19/2011 29.9* 35.0-47.0 % Final    Gestational Age at Delivery: [id number]  Antepartum complications: none Date of Delivery: 07/04/14  Delivered By: T. Reyes Aldaco, CNm Delivery Type: spontaneous vaginal delivery  Physical Exam:  General: alert Lochia: appropriate Uterine Fundus: firm Incision: N/a DVT Evaluation: No evidence of DVT seen on physical exam. Abdomen: abdomen is soft without significant tenderness, masses, organomegaly or guarding  Rubella: Immune Varicella: Immune TDAP: Given during pregnancy Contraception: Nexplanon  Discharge Diagnoses: Term Pregnancy-delivered  Discharge Information: Date: 07/05/2014 Activity: pelvic rest Diet: routine Medications: PNV, Ibuprofen and Vicodin Condition: stable Instructions: Discharge instructions:  Call office if you have any of the following: headache, visual changes, fever >100 F, chills, breast concerns, excessive vaginal bleeding, incision drainage or problems, leg pain or redness, depression or any other concerns.  Activity: Do not lift > 10 lbs for 6 weeks.  No intercourse or tampons for 6 weeks.  No driving for 1-2 weeks.   Discharge to: home Follow-up Information    Follow up with Marta AntuBrothers, Wyona Neils, CNM. Schedule an appointment as soon as possible for a visit in 6 weeks.   Specialty:  Certified Nurse Midwife   Why:  for PP visit and Nexplanon insertion   Contact information:   [address] [place] [id number] [phone]       Newborn Data: Live born female  Birth Weight: 6 lb  3.7 oz (2825 g) APGAR: 9, 9  Home with mother.  Marta AntuBrothers, Jester Klingberg, [place] 07/05/2014, 2:09 PM

## 2014-07-06 LAB — ABO/RH: ABO/RH(D): O POS

## 2014-07-09 LAB — HIV ANTIBODY (ROUTINE TESTING W REFLEX): HIV Screen 4th Generation wRfx: NONREACTIVE

## 2014-07-09 LAB — RPR: RPR Ser Ql: NONREACTIVE

## 2016-05-25 ENCOUNTER — Encounter: Payer: Self-pay | Admitting: Certified Nurse Midwife

## 2016-05-25 ENCOUNTER — Ambulatory Visit (INDEPENDENT_AMBULATORY_CARE_PROVIDER_SITE_OTHER): Payer: Self-pay | Admitting: Certified Nurse Midwife

## 2016-05-25 VITALS — BP 92/52 | HR 68 | Ht 63.0 in | Wt 135.0 lb

## 2016-05-25 DIAGNOSIS — B3731 Acute candidiasis of vulva and vagina: Secondary | ICD-10-CM

## 2016-05-25 DIAGNOSIS — L292 Pruritus vulvae: Secondary | ICD-10-CM

## 2016-05-25 DIAGNOSIS — N898 Other specified noninflammatory disorders of vagina: Secondary | ICD-10-CM

## 2016-05-25 DIAGNOSIS — B373 Candidiasis of vulva and vagina: Secondary | ICD-10-CM

## 2016-05-25 DIAGNOSIS — N760 Acute vaginitis: Secondary | ICD-10-CM | POA: Insufficient documentation

## 2016-05-25 LAB — POCT WET PREP (WET MOUNT): Trichomonas Wet Prep HPF POC: ABSENT

## 2016-05-25 NOTE — Progress Notes (Signed)
Obstetrics & Gynecology Office Visit   Chief Complaint:  Chief Complaint  Patient presents with  . Vaginitis    History of Present Illness: Vaginitis: Patient complains of an abnormal vaginal discharge since before her LMP 05/10/2016. Vulvovaginal symptoms include local irritation and vulvar itching.Marland KitchenSTI Risk: low risk, not currently sexually active.Discharge described as: white.Other associated symptoms: none.Menstrual pattern: She had been bleeding regularly. Contraception: abstinence. Started Monistat 7 before her last menstrual period which helped to relieve symtoms initially, but the vulvar itching and irritation restarted after menses ended. Has been applying coconut oil externally. Complains of chronic yeast infections. Has treated self with Monistat 3-4 times in the last year.     Review of Systems:  Review of Systems  Constitutional: Negative for chills and fever.  Genitourinary: Negative for dysuria.       Positive for vaginal discharge  Skin: Positive for itching (of vulva).     Past Medical History:  Past Medical History:  Diagnosis Date  . Fibrocystic breast changes     Past Surgical History:  Past Surgical History:  Procedure Laterality Date  . NO PAST SURGERIES      Gynecologic History: Patient's last menstrual period was 05/10/2016 (exact date).  Obstetric History: G3P1011  Family History:  Family History  Problem Relation Age of Onset  . Diabetes Mother     Social History:  Social History   Social History  . Marital status: Single    Spouse name: N/A  . Number of children: N/A  . Years of education: N/A   Occupational History  . Not on file.   Social History Main Topics  . Smoking status: Never Smoker  . Smokeless tobacco: Never Used  . Alcohol use No  . Drug use: No  . Sexual activity: Yes    Birth control/ protection: None   Other Topics Concern  . Not on file   Social History Narrative  . No narrative on file    Allergies:    Allergies  Allergen Reactions  . Penicillins Hives    Medications: Prior to Admission medications   Not on File    Physical Exam Vitals: BP (!) 92/52   Pulse 68   Ht  (1.6 m)   Wt 61.2 kg (135 lb)   LMP 05/10/2016 (Exact Date)   BMI 23.91 kg/m   Physical Exam  Constitutional: She appears well-developed and well-nourished. No distress.  Genitourinary:  Genitourinary Comments: Vulva: mild inflammation of labia Vagina: moderate amt curd-like white discharge Cervix: no lesions, parous, no bleeding   Results for orders placed or performed in visit on 05/25/16 (from the past 24 hour(s))  POCT Wet Prep Mellody Drown [place])     Status: Abnormal   Collection Time: 05/25/16  9:57 PM  Result Value Ref Range   Source Wet Prep POC     WBC, Wet Prep HPF POC TNTC    Bacteria Wet Prep HPF POC  Few   BACTERIA WET PREP MORPHOLOGY POC     Clue Cells Wet Prep HPF POC None None   Clue Cells Wet Prep Whiff POC     Yeast Wet Prep HPF POC Too numerous to count     KOH Wet Prep POC     Trichomonas Wet Prep HPF POC Absent Absent    Assessment: 26 y.o. [id number] with monilial vulvovaginitis  Plan: Terazol 7 cream. Insert one applicatorfull qhs x 7 nights, then one applicatorfull weekly for 7 weeks. Can apply externally 2-3 times/day prn. Rx for  Diflucan 150 mgm also given per patient request. Discussed avoiding all fragrances in hygiene products, bubble baths etc.   Farrel Conners, CNM

## 2017-08-31 ENCOUNTER — Encounter: Payer: Self-pay | Admitting: Obstetrics & Gynecology

## 2017-08-31 ENCOUNTER — Ambulatory Visit (INDEPENDENT_AMBULATORY_CARE_PROVIDER_SITE_OTHER): Payer: Self-pay | Admitting: Obstetrics & Gynecology

## 2017-08-31 VITALS — BP 110/60 | Ht 62.0 in | Wt 135.0 lb

## 2017-08-31 DIAGNOSIS — N760 Acute vaginitis: Secondary | ICD-10-CM | POA: Insufficient documentation

## 2017-08-31 DIAGNOSIS — N898 Other specified noninflammatory disorders of vagina: Secondary | ICD-10-CM

## 2017-08-31 DIAGNOSIS — B9689 Other specified bacterial agents as the cause of diseases classified elsewhere: Secondary | ICD-10-CM

## 2017-08-31 MED ORDER — METRONIDAZOLE 500 MG PO TABS: 500.0000 mg | ORAL_TABLET | Freq: Two times a day (BID) | ORAL | 0 refills | Status: DC

## 2017-08-31 NOTE — Patient Instructions (Signed)
Metronidazole tablets or capsules What is this medicine? METRONIDAZOLE (me troe NI da zole) is an antiinfective. It is used to treat certain kinds of bacterial and protozoal infections. It will not work for colds, flu, or other viral infections. This medicine may be used for other purposes; ask your health care provider or pharmacist if you have questions. COMMON BRAND NAME(S): Flagyl What should I tell my health care provider before I take this medicine? They need to know if you have any of these conditions: -anemia or other blood disorders -disease of the nervous system -fungal or yeast infection -if you drink alcohol containing drinks -liver disease -seizures -an unusual or allergic reaction to metronidazole, or other medicines, foods, dyes, or preservatives -pregnant or trying to get pregnant -breast-feeding How should I use this medicine? Take this medicine by mouth with a full glass of water. Follow the directions on the prescription label. Take your medicine at regular intervals. Do not take your medicine more often than directed. Take all of your medicine as directed even if you think you are better. Do not skip doses or stop your medicine early. Talk to your pediatrician regarding the use of this medicine in children. Special care may be needed. Overdosage: If you think you have taken too much of this medicine contact a poison control center or emergency room at once. NOTE: This medicine is only for you. Do not share this medicine with others. What if I miss a dose? If you miss a dose, take it as soon as you can. If it is almost time for your next dose, take only that dose. Do not take double or extra doses. What may interact with this medicine? Do not take this medicine with any of the following medications: -alcohol or any product that contains alcohol -amprenavir oral solution -cisapride -disulfiram -dofetilide -dronedarone -paclitaxel injection -pimozide -ritonavir oral  solution -sertraline oral solution -sulfamethoxazole-trimethoprim injection -thioridazine -ziprasidone This medicine may also interact with the following medications: -birth control pills -cimetidine -lithium -other medicines that prolong the QT interval (cause an abnormal heart rhythm) -phenobarbital -phenytoin -warfarin This list may not describe all possible interactions. Give your health care provider a list of all the medicines, herbs, non-prescription drugs, or dietary supplements you use. Also tell them if you smoke, drink alcohol, or use illegal drugs. Some items may interact with your medicine. What should I watch for while using this medicine? Tell your doctor or health care professional if your symptoms do not improve or if they get worse. You may get drowsy or dizzy. Do not drive, use machinery, or do anything that needs mental alertness until you know how this medicine affects you. Do not stand or sit up quickly, especially if you are an older patient. This reduces the risk of dizzy or fainting spells. Avoid alcoholic drinks while you are taking this medicine and for three days afterward. Alcohol may make you feel dizzy, sick, or flushed. If you are being treated for a sexually transmitted disease, avoid sexual contact until you have finished your treatment. Your sexual partner may also need treatment. What side effects may I notice from receiving this medicine? Side effects that you should report to your doctor or health care professional as soon as possible: -allergic reactions like skin rash or hives, swelling of the face, lips, or tongue -confusion, clumsiness -difficulty speaking -discolored or sore mouth -dizziness -fever, infection -numbness, tingling, pain or weakness in the hands or feet -trouble passing urine or change in the amount of   urine °-redness, blistering, peeling or loosening of the skin, including inside the mouth °-seizures °-unusually weak or  tired °-vaginal irritation, dryness, or discharge °Side effects that usually do not require medical attention (report to your doctor or health care professional if they continue or are bothersome): °-diarrhea °-headache °-irritability °-metallic taste °-nausea °-stomach pain or cramps °-trouble sleeping °This list may not describe all possible side effects. Call your doctor for medical advice about side effects. You may report side effects to FDA at 1-800-FDA-1088. °Where should I keep my medicine? °Keep out of the reach of children. °Store at room temperature below 25 degrees C (77 degrees F). Protect from light. Keep container tightly closed. Throw away any unused medicine after the expiration date. °NOTE: This sheet is a summary. It may not cover all possible information. If you have questions about this medicine, talk to your doctor, pharmacist, or health care provider. °© 2018 Elsevier/Gold Standard (2012-09-13 14:08:39) ° °

## 2017-08-31 NOTE — Progress Notes (Signed)
HPI:      Heidi Hunt is a 27 y.o. [id number] who LMP was No LMP recorded., presents today for a problem visit.  She complains of:  Vaginitis: Patient complains of an abnormal vaginal discharge for 2 weeks. Vaginal symptoms include burning, discharge described as clear and malodorous, local irritation and odor.Vulvar symptoms include none.STI Risk: Very low risk of STD exposureDischarge described as: clear and malodorous.Other associated symptoms: burning and local irritation.Menstrual pattern: She had been bleeding regularly. Contraception: none.  Recent change in soap and use of bath bubbles- followed by sx's.  PMHx: She  has a past medical history of Fibrocystic breast changes. Also,  has a past surgical history that includes No past surgeries., family history includes Diabetes in her mother.,  reports that she has never smoked. She has never used smokeless tobacco. She reports that she does not drink alcohol or use drugs.  She has a current medication list which includes the following prescription(s): metronidazole, and the following Facility-Administered Medications: bupivacaine (pf) and lidocaine-epinephrine. Also, is allergic to penicillins.  Review of Systems  Constitutional: Positive for malaise/fatigue. Negative for chills and fever.  HENT: Negative for congestion, sinus pain and sore throat.   Eyes: Negative for blurred vision and pain.  Respiratory: Negative for cough and wheezing.   Cardiovascular: Negative for chest pain and leg swelling.  Gastrointestinal: Negative for abdominal pain, constipation, diarrhea, heartburn, nausea and vomiting.  Genitourinary: Negative for dysuria, frequency, hematuria and urgency.  Musculoskeletal: Negative for back pain, joint pain, myalgias and neck pain.  Skin: Negative for itching and rash.  Neurological: Negative for dizziness, tremors and weakness.  Endo/Heme/Allergies: Does not bruise/bleed easily.  Psychiatric/Behavioral: Negative  for depression. The patient is not nervous/anxious and does not have insomnia.     Objective: BP 110/60   Ht 5\' 2"  (1.575 m)   Wt 135 lb (61.2 kg)   Breastfeeding? No   BMI 24.69 kg/m  Physical Exam  Constitutional: She is oriented to person, place, and time. She appears well-developed and well-nourished. No distress.  Genitourinary: Vagina normal and uterus normal. Pelvic exam was performed with patient supine. There is no rash, tenderness or lesion on the right labia. There is no rash, tenderness or lesion on the left labia. No erythema or bleeding in the vagina. Right adnexum does not display mass and does not display tenderness. Left adnexum does not display mass and does not display tenderness. Cervix does not exhibit motion tenderness, discharge, polyp or nabothian cyst.   Uterus is mobile and midaxial. Uterus is not enlarged or exhibiting a mass.  HENT:  Head: Normocephalic and atraumatic.  Nose: Nose normal.  Mouth/Throat: Oropharynx is clear and moist.  Abdominal: Soft. She exhibits no distension. There is no tenderness.  Musculoskeletal: Normal range of motion.  Neurological: She is alert and oriented to person, place, and time. No cranial nerve deficit.  Skin: Skin is warm and dry.  Psychiatric: She has a normal mood and affect.   Microscopic wet-mount exam shows clue cells, excessive bacteria.  ASSESSMENT/PLAN:    Problem List Items Addressed This Visit      Genitourinary   BV (bacterial vaginosis)   Relevant Medications   metroNIDAZOLE (FLAGYL) 500 MG tablet    Other Visit Diagnoses    Vaginal discharge    -  Primary   Relevant Orders   Cervicovaginal ancillary only    Treat BV w Flagyl.  Alternatives and SE discussed Needs PAP; declines until has insurance due to cost  concerns  Annamarie MajorPaul Taysha Majewski, MD, Merlinda FrederickFACOG [facility], [facility] 08/31/2017  3:51 PM

## 2017-09-03 ENCOUNTER — Ambulatory Visit: Payer: Self-pay | Admitting: Obstetrics and Gynecology

## 2017-09-05 ENCOUNTER — Other Ambulatory Visit: Payer: Self-pay | Admitting: Obstetrics & Gynecology

## 2017-09-05 ENCOUNTER — Telehealth: Payer: Self-pay

## 2017-09-05 MED ORDER — FLUCONAZOLE 150 MG PO TABS: ORAL_TABLET | ORAL | 1 refills | Status: DC

## 2017-09-05 NOTE — Telephone Encounter (Signed)
Pt aware via vm 

## 2017-09-05 NOTE — Telephone Encounter (Signed)
[facility] She is requesting a rx for Diflucan. ZO#[phone]#[phone]. Ok to leave message.

## 2017-09-05 NOTE — Telephone Encounter (Signed)
ERx done

## 2017-11-20 ENCOUNTER — Other Ambulatory Visit (HOSPITAL_COMMUNITY)
Admission: RE | Admit: 2017-11-20 | Discharge: 2017-11-20 | Disposition: A | Discharge status: Home / Self Care | Payer: Self-pay | Source: Ambulatory Visit | Attending: Obstetrics and Gynecology | Admitting: Obstetrics and Gynecology

## 2017-11-20 ENCOUNTER — Encounter: Payer: Self-pay | Admitting: Obstetrics and Gynecology

## 2017-11-20 ENCOUNTER — Ambulatory Visit (INDEPENDENT_AMBULATORY_CARE_PROVIDER_SITE_OTHER): Payer: Medicaid Other | Admitting: Obstetrics and Gynecology

## 2017-11-20 VITALS — BP 106/56 | HR 60 | Ht 63.0 in | Wt 138.0 lb

## 2017-11-20 DIAGNOSIS — Z30015 Encounter for initial prescription of vaginal ring hormonal contraceptive: Secondary | ICD-10-CM

## 2017-11-20 DIAGNOSIS — N3001 Acute cystitis with hematuria: Secondary | ICD-10-CM

## 2017-11-20 DIAGNOSIS — Z113 Encounter for screening for infections with a predominantly sexual mode of transmission: Secondary | ICD-10-CM | POA: Insufficient documentation

## 2017-11-20 DIAGNOSIS — Z124 Encounter for screening for malignant neoplasm of cervix: Secondary | ICD-10-CM | POA: Insufficient documentation

## 2017-11-20 DIAGNOSIS — Z0000 Encounter for general adult medical examination without abnormal findings: Secondary | ICD-10-CM | POA: Diagnosis not present

## 2017-11-20 DIAGNOSIS — Z01419 Encounter for gynecological examination (general) (routine) without abnormal findings: Secondary | ICD-10-CM

## 2017-11-20 LAB — POCT URINALYSIS DIPSTICK
BILIRUBIN UA: NEGATIVE
Glucose, UA: NEGATIVE
Ketones, UA: NEGATIVE
Nitrite, UA: NEGATIVE
Protein, UA: NEGATIVE
Spec Grav, UA: 1.01 (ref 1.010–1.025)
pH, UA: 6 (ref 5.0–8.0)

## 2017-11-20 MED ORDER — ETONOGESTREL-ETHINYL ESTRADIOL 0.12-0.015 MG/24HR VA RING: VAGINAL_RING | VAGINAL | 3 refills | Status: AC

## 2017-11-20 MED ORDER — NITROFURANTOIN MONOHYD MACRO 100 MG PO CAPS: 100.0000 mg | ORAL_CAPSULE | Freq: Two times a day (BID) | ORAL | 0 refills | Status: AC

## 2017-11-20 NOTE — Patient Instructions (Signed)
I value your feedback and entrusting us with your care. If you get a [place] patient survey, I would appreciate you taking the time to let us know about your experience today. Thank you! 

## 2017-11-20 NOTE — Progress Notes (Signed)
PCP:  Patient, No Pcp Per   Chief Complaint  Patient presents with  . Gynecologic Exam    burning after urinating uncomfortable while urinating per pt, would like the copper IUD inserted today     HPI:      [name] is a 27 y.o. [id number] who LMP was Patient's last menstrual period was 11/05/2017 (approximate)., presents today for her annual examination.  Her menses are regular every 28-30 days, lasting 4-5 days.  Dysmenorrhea mild, occurring first 1-2 days of flow. She does not have intermenstrual bleeding.  Sex activity: single partner, contraception - condoms. Pt would like paragard vs nuvaring. Had mirena in past but didn't like how she felt with it and didn't like irreg bleeding. Wants monthly menses, too.  Last Pap: November 20, 2013  Results were: no abnormalities  Hx of STDs: none  Pt complains of dysuria after urination for the past 2 days. Has frequency with and without urgency, as well as mild pelvic pain. No vag sx, no fevers, hematuria. BV sx resolved with flagyl tx 6/19.   There is no FH of breast cancer. There is no FH of ovarian cancer. The patient does do self-breast exams.  Tobacco use: The patient denies current or previous tobacco use. Alcohol use: none No drug use.  Exercise: moderately active  She does get adequate calcium and Vitamin D in her diet.   Past Medical History:  Diagnosis Date  . Fibrocystic breast changes     Past Surgical History:  Procedure Laterality Date  . NO PAST SURGERIES      Family History  Problem Relation Age of Onset  . Diabetes Mother        DM Type 2  . Breast cancer Neg Hx   . Ovarian cancer Neg Hx     Social History   Socioeconomic History  . Marital status: Single    Spouse name: Not on file  . Number of children: Not on file  . Years of education: Not on file  . Highest education level: Not on file  Occupational History  . Not on file  Social Needs  . Financial resource strain: Not on file  .  Food insecurity:    Worry: Not on file    Inability: Not on file  . Transportation needs:    Medical: Not on file    Non-medical: Not on file  Tobacco Use  . Smoking status: Never Smoker  . Smokeless tobacco: Never Used  Substance and Sexual Activity  . Alcohol use: No  . Drug use: No  . Sexual activity: Yes    Birth control/protection: Condom  Lifestyle  . Physical activity:    Days per week: Not on file    Minutes per session: Not on file  . Stress: Not on file  Relationships  . Social connections:    Talks on phone: Not on file    Gets together: Not on file    Attends religious service: Not on file    Active member of club or organization: Not on file    Attends meetings of clubs or organizations: Not on file    Relationship status: Not on file  . Intimate partner violence:    Fear of current or ex partner: Not on file    Emotionally abused: Not on file    Physically abused: Not on file    Forced sexual activity: Not on file  Other Topics Concern  . Not on file  Social History Narrative  . Not on file    Outpatient Medications Prior to Visit  Medication Sig Dispense Refill  . fluconazole (DIFLUCAN) 150 MG tablet One po dose 1 tablet 1  . metroNIDAZOLE (FLAGYL) 500 MG tablet Take 1 tablet (500 mg total) by mouth 2 (two) times daily. 14 tablet 0   Facility-Administered Medications Prior to Visit  Medication Dose Route Frequency Provider Last Rate Last Dose  . bupivacaine (PF) (MARCAINE) 0.25 % injection    Anesthesia Intra-op Yves Dill, MD   10 mL at 07/04/14 0720  . lidocaine-EPINEPHrine 1.5 %-1:200000 injection    Anesthesia Intra-op Yves Dill, MD   4 mL at 07/04/14 0715      ROS:  Review of Systems  Constitutional: Negative for fatigue, fever and unexpected weight change.  Respiratory: Negative for cough, shortness of breath and wheezing.   Cardiovascular: Negative for chest pain, palpitations and leg swelling.  Gastrointestinal: Negative for blood  in stool, constipation, diarrhea, nausea and vomiting.  Endocrine: Negative for cold intolerance, heat intolerance and polyuria.  Genitourinary: Positive for dysuria. Negative for dyspareunia, flank pain, frequency, genital sores, hematuria, menstrual problem, pelvic pain, urgency, vaginal bleeding, vaginal discharge and vaginal pain.  Musculoskeletal: Negative for back pain, joint swelling and myalgias.  Skin: Negative for rash.  Neurological: Negative for dizziness, syncope, light-headedness, numbness and headaches.  Hematological: Negative for adenopathy.  Psychiatric/Behavioral: Negative for agitation, confusion, sleep disturbance and suicidal ideas. The patient is not nervous/anxious.   BREAST: No symptoms   Objective: BP (!) 106/56   Pulse 60   Ht 5\' 3"  (1.6 m)   Wt 138 lb (62.6 kg)   LMP 11/05/2017 (Approximate)   BMI 24.45 kg/m    Physical Exam  Constitutional: She is oriented to person, place, and time. She appears well-developed and well-nourished.  Genitourinary: Vagina normal and uterus normal. There is no rash or tenderness on the right labia. There is no rash or tenderness on the left labia. No erythema or tenderness in the vagina. No vaginal discharge found. Right adnexum does not display mass and does not display tenderness. Left adnexum does not display mass and does not display tenderness. Cervix does not exhibit motion tenderness or polyp. Uterus is not enlarged or tender.  Neck: Normal range of motion. No thyromegaly present.  Cardiovascular: Normal rate, regular rhythm and normal heart sounds.  No murmur heard. Pulmonary/Chest: Effort normal and breath sounds normal. Right breast exhibits no mass, no nipple discharge, no skin change and no tenderness. Left breast exhibits no mass, no nipple discharge, no skin change and no tenderness.  Abdominal: Soft. There is no tenderness. There is no guarding.  Musculoskeletal: Normal range of motion.  Neurological: She is alert  and oriented to person, place, and time. No cranial nerve deficit.  Psychiatric: She has a normal mood and affect. Her behavior is normal.  Vitals reviewed.   Results: Results for orders placed or performed in visit on 11/20/17 (from the past 24 hour(s))  POCT Urinalysis Dipstick     Status: Abnormal   Collection Time: 11/20/17  4:24 PM  Result Value Ref Range   Color, UA yellow    Clarity, UA clear    Glucose, UA Negative Negative   Bilirubin, UA neg    Ketones, UA neg    Spec Grav, UA 1.010 1.010 - 1.025   Blood, UA small    pH, UA 6.0 5.0 - 8.0   Protein, UA Negative Negative   Urobilinogen, UA  Nitrite, UA neg    Leukocytes, UA Small (1+) (A) Negative   Appearance     Odor      Assessment/Plan: Encounter for annual routine gynecological examination  Cervical cancer screening - Plan: Cytology - PAP  Screening for STD (sexually transmitted disease) - Plan: Cytology - PAP  Encounter for initial prescription of vaginal ring hormonal contraceptive - BC options discussed. Try nuvaring with next menses. Rx eRxd, 1 sample. Condoms. F/u prn. Also discussed paragard and pt will call for menses if desires - Plan: etonogestrel-ethinyl estradiol (NUVARING) 0.12-0.015 MG/24HR vaginal ring  Acute cystitis with hematuria - Pos dip/sx. Rx macrobid. Check C&S. F/u prn - Plan: POCT Urinalysis Dipstick, nitrofurantoin, macrocrystal-monohydrate, (MACROBID) 100 MG capsule, Urine Culture  Meds ordered this encounter  Medications  . etonogestrel-ethinyl estradiol (NUVARING) 0.12-0.015 MG/24HR vaginal ring    Sig: Insert vaginally and leave in place for 3 consecutive weeks, then remove for 1 week.    Dispense:  3 each    Refill:  3    Order Specific Question:   Supervising Provider    Answer:   Nadara Mustard [id number]  . nitrofurantoin, macrocrystal-monohydrate, (MACROBID) 100 MG capsule    Sig: Take 1 capsule (100 mg total) by mouth 2 (two) times daily for 5 days.    Dispense:  10  capsule    Refill:  0    Order Specific Question:   Supervising Provider    Answer:   Nadara Mustard [[id number]]             GYN counsel family planning choices, adequate intake of calcium and vitamin D, diet and exercise     F/U  Return in about 1 year (around 11/21/2018).  Alicia B. Copland, PA-C 11/20/2017 4:27 PM

## 2017-11-22 LAB — URINE CULTURE

## 2017-11-22 LAB — CYTOLOGY - PAP
Adequacy: ABSENT
Chlamydia: NEGATIVE
Diagnosis: NEGATIVE
Neisseria Gonorrhea: NEGATIVE
TRICH (WINDOWPATH): NEGATIVE

## 2019-09-22 ENCOUNTER — Other Ambulatory Visit: Payer: Self-pay

## 2019-09-22 ENCOUNTER — Emergency Department
Admission: EM | Admit: 2019-09-22 | Discharge: 2019-09-23 | Disposition: A | Discharge status: Home / Self Care | Payer: Medicaid Other | Attending: Emergency Medicine | Admitting: Emergency Medicine

## 2019-09-22 ENCOUNTER — Telehealth: Payer: Self-pay

## 2019-09-22 DIAGNOSIS — N939 Abnormal uterine and vaginal bleeding, unspecified: Secondary | ICD-10-CM | POA: Diagnosis present

## 2019-09-22 LAB — CBC
HCT: 38.1 % (ref 36.0–46.0)
Hemoglobin: 12.4 g/dL (ref 12.0–15.0)
MCH: 30.2 pg (ref 26.0–34.0)
MCHC: 32.5 g/dL (ref 30.0–36.0)
MCV: 92.9 fL (ref 80.0–100.0)
Platelets: 153 10*3/uL (ref 150–400)
RBC: 4.1 MIL/uL (ref 3.87–5.11)
RDW: 13.7 % (ref 11.5–15.5)
WBC: 7.4 10*3/uL (ref 4.0–10.5)
nRBC: 0 % (ref 0.0–0.2)

## 2019-09-22 LAB — COMPREHENSIVE METABOLIC PANEL
ALT: 8 U/L (ref 0–44)
AST: 19 U/L (ref 15–41)
Albumin: 4 g/dL (ref 3.5–5.0)
Alkaline Phosphatase: 46 U/L (ref 38–126)
Anion gap: 7 (ref 5–15)
BUN: 9 mg/dL (ref 6–20)
CO2: 24 mmol/L (ref 22–32)
Calcium: 8.6 mg/dL — ABNORMAL LOW (ref 8.9–10.3)
Chloride: 108 mmol/L (ref 98–111)
Creatinine, Ser: 0.71 mg/dL (ref 0.44–1.00)
GFR calc Af Amer: >60 mL/min (ref >60)
GFR calc non Af Amer: >60 mL/min (ref >60)
Glucose, Bld: 93 mg/dL (ref 70–99)
Potassium: 3.4 mmol/L — ABNORMAL LOW (ref 3.5–5.1)
Sodium: 139 mmol/L (ref 135–145)
Total Bilirubin: 0.6 mg/dL (ref 0.3–1.2)
Total Protein: 7.2 g/dL (ref 6.5–8.1)

## 2019-09-22 LAB — POCT PREGNANCY, URINE: Preg Test, Ur: NEGATIVE

## 2019-09-22 LAB — URINALYSIS, COMPLETE (UACMP) WITH MICROSCOPIC
Bilirubin Urine: NEGATIVE
Glucose, UA: NEGATIVE mg/dL
Ketones, ur: NEGATIVE mg/dL
Leukocytes,Ua: NEGATIVE
Nitrite: NEGATIVE
Protein, ur: NEGATIVE mg/dL
Specific Gravity, Urine: 1.005 (ref 1.005–1.030)
pH: 6 (ref 5.0–8.0)

## 2019-09-22 NOTE — Telephone Encounter (Signed)
Patient returning call from [place]. Patient aware Leadwood Sink is assisting another patient at this time and will call her back.

## 2019-09-22 NOTE — ED Triage Notes (Signed)
Pt states that she is currently exepreincing vaginal bleeding and passing tissue and blood clots. Pt reports she had her last period July 15th. Pt states in triage she took a plan b pill in April, experienced bleeding for "a long time" and has since had 2 normal periods. Reports bleeding started today and she is concerned due to her hx.

## 2019-09-22 NOTE — Telephone Encounter (Signed)
LMTC

## 2019-09-22 NOTE — ED Provider Notes (Signed)
[facility] Emergency Department Provider Note  Time seen: 10:37 PM  I have reviewed the triage vital signs and the nursing notes.   HISTORY  Chief Complaint Vaginal Bleeding   HPI Heidi Hunt is a 29 y.o. female with a past medical history of a recent miscarriage presents to the emergency department for heavy vaginal bleeding.  According to the patient in April she was approximately [redacted] weeks pregnant when she had a medical abortion.  Patient states she had heavy bleeding for approximately a week afterwards and has since had 2 normal periods last of which occurred approximately 2 weeks ago.  However she states over the past day or 2 she has began experiencing heavy bleeding once again however this time with what appears to be tissue.  Patient was concerned given her recent miscarriage so she came to the emergency department for evaluation.  Patient states moderate lower abdominal cramping along with moderate bleeding.  No fever.   Past Medical History:  Diagnosis Date  . Fibrocystic breast changes     Patient Active Problem List   Diagnosis Date Noted  . BV (bacterial vaginosis) 08/31/2017  . 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 NO PAST SURGERIES      Prior to Admission medications   Medication Sig Start Date End Date Taking? Authorizing Provider  etonogestrel-ethinyl estradiol (NUVARING) 0.12-0.015 MG/24HR vaginal ring Insert vaginally and leave in place for 3 consecutive weeks, then remove for 1 week. 11/20/17   Copland, Ilona Sorrel, PA-C    Allergies  Allergen Reactions  . Penicillins Hives    Family History  Problem Relation Age of Onset  . Diabetes Mother        DM Type 2  . Breast cancer Neg Hx   . Ovarian cancer Neg Hx     Social History Social History   Tobacco Use  . Smoking status: Never Smoker  . Smokeless tobacco: Never Used  Vaping Use  . Vaping Use: Never used  Substance  Use Topics  . Alcohol use: No  . Drug use: No    Review of Systems Constitutional: Negative for fever. Cardiovascular: Negative for chest pain. Respiratory: Negative for shortness of breath. Gastrointestinal: Moderate lower abdominal cramping. Genitourinary: Negative for urinary compaints.  Positive for vaginal bleeding. Musculoskeletal: Negative for musculoskeletal complaints Neurological: Negative for headache All other ROS negative  ____________________________________________   PHYSICAL EXAM:  VITAL SIGNS: ED Triage Vitals  Enc Vitals Group     BP 09/22/19 2005 133/85     Pulse Rate 09/22/19 2005 (!) 57     Resp 09/22/19 2005 16     Temp 09/22/19 2005 99.1 F (37.3 C)     Temp Source 09/22/19 2005 Oral     SpO2 09/22/19 2005 100 %     Weight 09/22/19 2006 145 lb (65.8 kg)     Height 09/22/19 2006 5\' 2"  (1.575 m)     Head Circumference --      Peak Flow --      Pain Score 09/22/19 2005 2     Pain Loc --      Pain Edu? --      Excl. in GC? --    Constitutional: Alert and oriented. Well appearing and in no distress. Eyes: Normal exam ENT      Head: Normocephalic and atraumatic.      Mouth/Throat: Mucous membranes are moist. Cardiovascular: Normal rate, regular rhythm. Respiratory: Normal respiratory  effort without tachypnea nor retractions. Breath sounds are clear  Gastrointestinal: Soft, slight suprapubic tenderness otherwise benign abdominal exam.  No rebound guarding or distention. Musculoskeletal: Nontender with normal range of motion in all extremities. Neurologic:  Normal speech and language. No gross focal neurologic deficits  Skin:  Skin is warm, dry and intact.  Psychiatric: Mood and affect are normal.   ____________________________________________     RADIOLOGY  Pelvic ultrasound pending  ____________________________________________   INITIAL IMPRESSION / ASSESSMENT AND PLAN / ED COURSE  Pertinent labs & imaging results that were available  during my care of the patient were reviewed by me and considered in my medical decision making (see chart for details).   Patient presents emergency department for vaginal bleeding with tissue passage having recently underwent an abortion 3 months ago.  Overall the patient appears well, reassuring lab work, slight suprapubic tenderness otherwise benign abdominal exam.  However given the patient's recent abortion/miscarriage along with her complaint of heavier bleeding with cramping and tissue passage we will obtain an ultrasound to further evaluate.  Reassuringly patient's pregnancy test in the emergency department is negative.  Pelvic examination does appear to show small amount of tissue in the cervix.  We will obtain a pelvic ultrasound to further evaluate.  Patient agreeable.  Heidi Hunt was evaluated in Emergency Department on 09/22/2019 for the symptoms described in the history of present illness. She was evaluated in the context of the global COVID-19 pandemic, which necessitated consideration that the patient might be at risk for infection with the SARS-CoV-2 virus that causes COVID-19. Institutional protocols and algorithms that pertain to the evaluation of patients at risk for COVID-19 are in a state of rapid change based on information released by regulatory bodies including the CDC and federal and state organizations. These policies and algorithms were followed during the patient's care in the ED.  ____________________________________________   FINAL CLINICAL IMPRESSION(S) / ED DIAGNOSES  Dysfunctional uterine bleeding   Minna Antis, MD 09/22/19 2250

## 2019-09-22 NOTE — Telephone Encounter (Signed)
Pt calling for same day appt but was told we had no openings;  Had normal period 09/04/19; just started an abnl period yesterday - is passing a lot of tissue and feels diff from normal period; no bc.  [phone]

## 2019-09-22 NOTE — ED Provider Notes (Signed)
-----------------------------------------   11:11 PM on 09/22/2019 -----------------------------------------  Assuming care from Dr. Lenard Lance.  In short, Heidi Hunt is a 29 y.o. female with a chief complaint of vaginal bleeding about 3-4 months after miscarriage.  Refer to the original H&P for additional details.  The current plan of care is to obtain ultrasound and reassess.   ----------------------------------------- 1:59 AM on 09/23/2019 -----------------------------------------  Ultrasound normal.  Patient comfortable withut symptoms at this time.  I updated her about the ultrasound and she feels reassured and is comfortable with the plan for discharge and outpatient follow-up.  I gave my usual and customary return precautions.   Loleta Rose, MD 09/23/19 [phone]

## 2019-09-22 NOTE — Telephone Encounter (Signed)
Pt needs to take UPT. If neg, can sched appt when we have opening. If pos, needs to be sent to OB. Thx

## 2019-09-22 NOTE — ED Notes (Signed)
Pt resting in bed at this time, denies any needs, call bell in reach.

## 2019-09-22 NOTE — ED Notes (Signed)
See triage note, pt reports vaginal bleeding and recently taking plan B in April. Pt in NAD

## 2019-09-22 NOTE — Telephone Encounter (Signed)
Pt states she took a UPT yesterday and it was negative.  Pt tx'd to [facility] for scheduling of AEX.

## 2019-09-23 ENCOUNTER — Emergency Department: Payer: Medicaid Other

## 2019-09-23 NOTE — ED Notes (Signed)
Pt to US at this time.

## 2019-10-22 ENCOUNTER — Ambulatory Visit: Payer: Medicaid Other | Admitting: Obstetrics and Gynecology

## 2019-10-22 NOTE — Progress Notes (Deleted)
PCP:  [facility]   No chief complaint on file.    HPI:      [name] is a 29 y.o. [id number] who LMP was No LMP recorded., presents today for her annual examination.  Her menses are regular every 28-30 days, lasting 4-5 days.  Dysmenorrhea mild, occurring first 1-2 days of flow. She does not have intermenstrual bleeding. S/P EAB 7/21 Sex activity: single partner, contraception - condoms. Pt would like paragard vs nuvaring. Had mirena in past but didn't like how she felt with it and didn't like irreg bleeding. Wants monthly menses, too.  Last Pap: 11/20/17  Results were: no abnormalities  Hx of STDs: none  Pt complains of dysuria after urination for the past 2 days. Has frequency with and without urgency, as well as mild pelvic pain. No vag sx, no fevers, hematuria. BV sx resolved with flagyl tx 6/19.   There is no FH of breast cancer. There is no FH of ovarian cancer. The patient does do self-breast exams.  Tobacco use: The patient denies current or previous tobacco use. Alcohol use: none No drug use.  Exercise: moderately active  She does get adequate calcium and Vitamin D in her diet.   Past Medical History:  Diagnosis Date  . Fibrocystic breast changes     Past Surgical History:  Procedure Laterality Date  . NO PAST SURGERIES      Family History  Problem Relation Age of Onset  . Diabetes Mother        DM Type 2  . Breast cancer Neg Hx   . Ovarian cancer Neg Hx     Social History   Socioeconomic History  . Marital status: Single    Spouse name: Not on file  . Number of children: Not on file  . Years of education: Not on file  . Highest education level: Not on file  Occupational History  . Not on file  Tobacco Use  . Smoking status: Never Smoker  . Smokeless tobacco: Never Used  Vaping Use  . Vaping Use: Never used  Substance and Sexual Activity  . Alcohol use: No  . Drug use: No  . Sexual activity: Yes    Birth  control/protection: Condom  Other Topics Concern  . Not on file  Social History Narrative  . Not on file   Social Determinants of Health   Financial Resource Strain:   . Difficulty of Paying Living Expenses: Not on file  Food Insecurity:   . Worried About Programme researcher, broadcasting/film/video in the Last Year: Not on file  . Ran Out of Food in the Last Year: Not on file  Transportation Needs:   . Lack of Transportation (Medical): Not on file  . Lack of Transportation (Non-Medical): Not on file  Physical Activity:   . Days of Exercise per Week: Not on file  . Minutes of Exercise per Session: Not on file  Stress:   . Feeling of Stress : Not on file  Social Connections:   . Frequency of Communication with Friends and Family: Not on file  . Frequency of Social Gatherings with Friends and Family: Not on file  . Attends Religious Services: Not on file  . Active Member of Clubs or Organizations: Not on file  . Attends Banker Meetings: Not on file  . Marital Status: Not on file  Intimate Partner Violence:   . Fear of Current or Ex-Partner: Not on file  .  Emotionally Abused: Not on file  . Physically Abused: Not on file  . Sexually Abused: Not on file    Outpatient Medications Prior to Visit  Medication Sig Dispense Refill  . etonogestrel-ethinyl estradiol (NUVARING) 0.12-0.015 MG/24HR vaginal ring Insert vaginally and leave in place for 3 consecutive weeks, then remove for 1 week. 3 each 3   Facility-Administered Medications Prior to Visit  Medication Dose Route Frequency Provider Last Rate Last Admin  . bupivacaine (PF) (MARCAINE) 0.25 % injection    Anesthesia Intra-op Yves Dill, MD   10 mL at 07/04/14 0720  . lidocaine-EPINEPHrine 1.5 %-1:200000 injection    Anesthesia Intra-op Yves Dill, MD   4 mL at 07/04/14 0715      ROS:  Review of Systems  Constitutional: Negative for fatigue, fever and unexpected weight change.  Respiratory: Negative for cough, shortness of  breath and wheezing.   Cardiovascular: Negative for chest pain, palpitations and leg swelling.  Gastrointestinal: Negative for blood in stool, constipation, diarrhea, nausea and vomiting.  Endocrine: Negative for cold intolerance, heat intolerance and polyuria.  Genitourinary: Positive for dysuria. Negative for dyspareunia, flank pain, frequency, genital sores, hematuria, menstrual problem, pelvic pain, urgency, vaginal bleeding, vaginal discharge and vaginal pain.  Musculoskeletal: Negative for back pain, joint swelling and myalgias.  Skin: Negative for rash.  Neurological: Negative for dizziness, syncope, light-headedness, numbness and headaches.  Hematological: Negative for adenopathy.  Psychiatric/Behavioral: Negative for agitation, confusion, sleep disturbance and suicidal ideas. The patient is not nervous/anxious.   BREAST: No symptoms   Objective: There were no vitals taken for this visit.   Physical Exam Constitutional:      Appearance: She is well-developed.  Genitourinary:     Vagina and uterus normal.     No vaginal discharge, erythema or tenderness.     No cervical motion tenderness or polyp.     Uterus is not enlarged or tender.     No right or left adnexal mass present.     Right adnexa not tender.     Left adnexa not tender.  Neck:     Thyroid: No thyromegaly.  Cardiovascular:     Rate and Rhythm: Normal rate and regular rhythm.     Heart sounds: Normal heart sounds. No murmur heard.   Pulmonary:     Effort: Pulmonary effort is normal.     Breath sounds: Normal breath sounds.  Chest:     Breasts:        Right: No mass, nipple discharge, skin change or tenderness.        Left: No mass, nipple discharge, skin change or tenderness.  Abdominal:     Palpations: Abdomen is soft.     Tenderness: There is no abdominal tenderness. There is no guarding.  Musculoskeletal:        General: Normal range of motion.     Cervical back: Normal range of motion.    Neurological:     Mental Status: She is alert and oriented to person, place, and time.     Cranial Nerves: No cranial nerve deficit.  Psychiatric:        Behavior: Behavior normal.  Vitals reviewed.     Results: No results found for this or any previous visit (from the past 24 hour(s)).  Assessment/Plan: No diagnosis found.  No orders of the defined types were placed in this encounter.            GYN counsel family planning choices, adequate intake of calcium and vitamin D,  diet and exercise     F/U  No follow-ups on file.  Kaedyn Polivka B. Skippy [place]he[place]ka, PA-C 10/22/2019 11:52 AM

## 2021-03-02 IMAGING — US US PELVIS COMPLETE WITH TRANSVAGINAL
1 series · 14 of 25 positions shown · non-contrast
Comparison: None

CLINICAL DATA: Heavy vaginal bleeding



[Series 1: us pelvic complete with transvaginal · 14 of 115 slices shown]
[im 1/115]
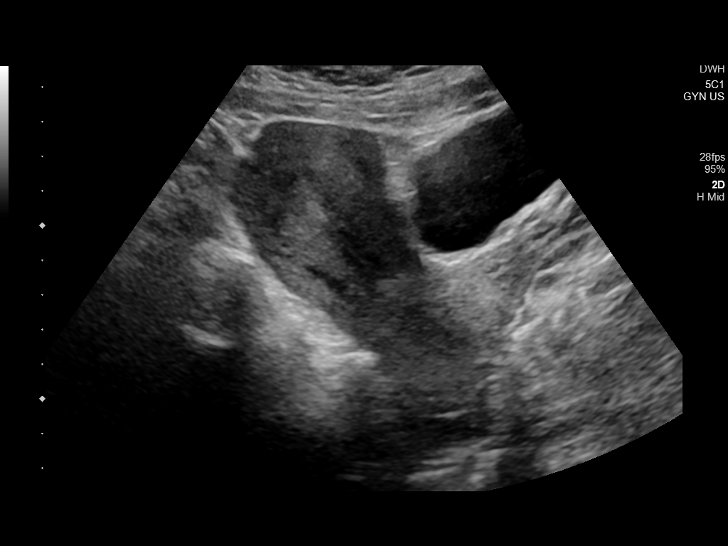
[im 10/115]
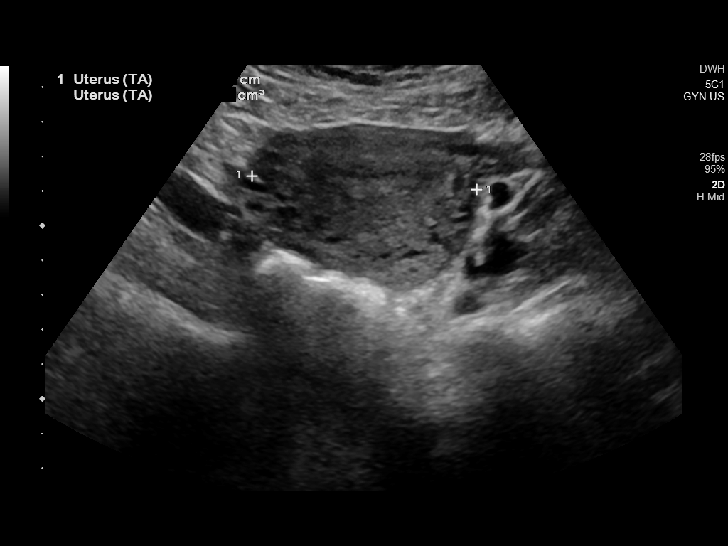
[im 20/115]
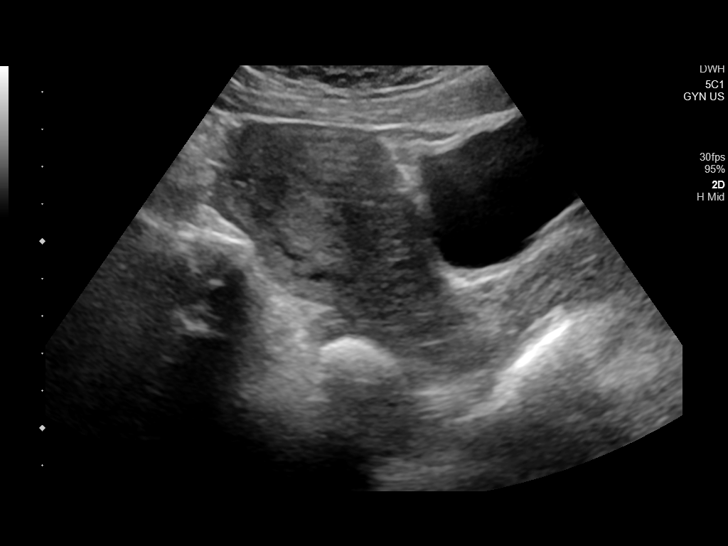
[im 29/115]
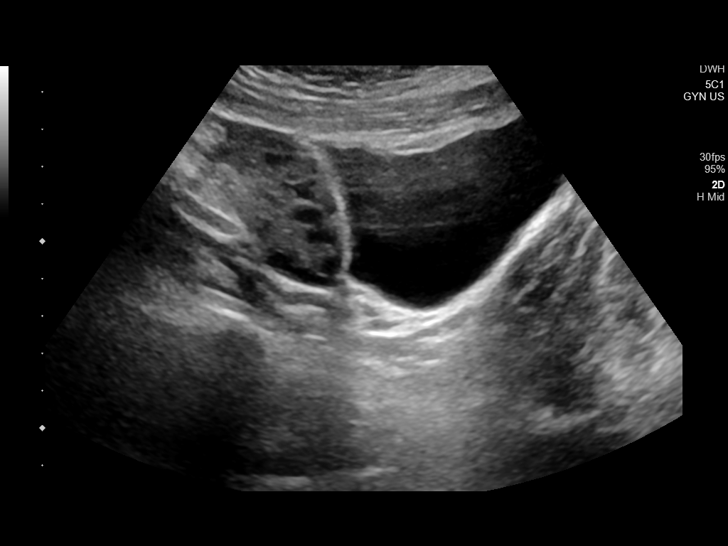
[im 39/115]
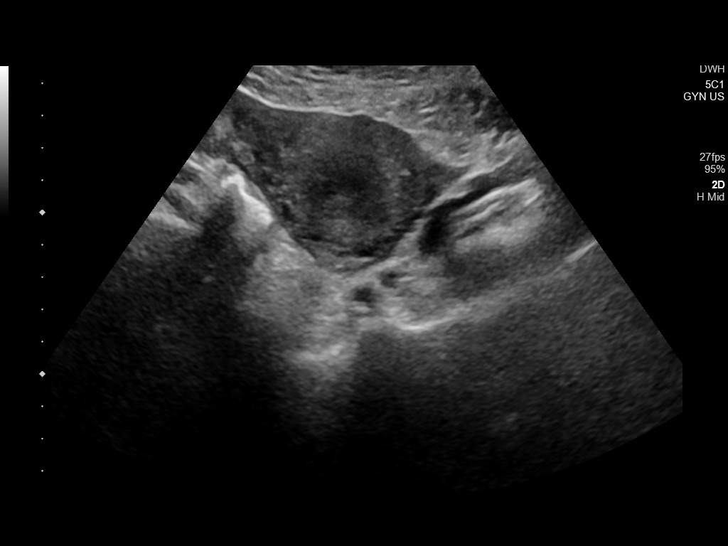
[im 43/115]
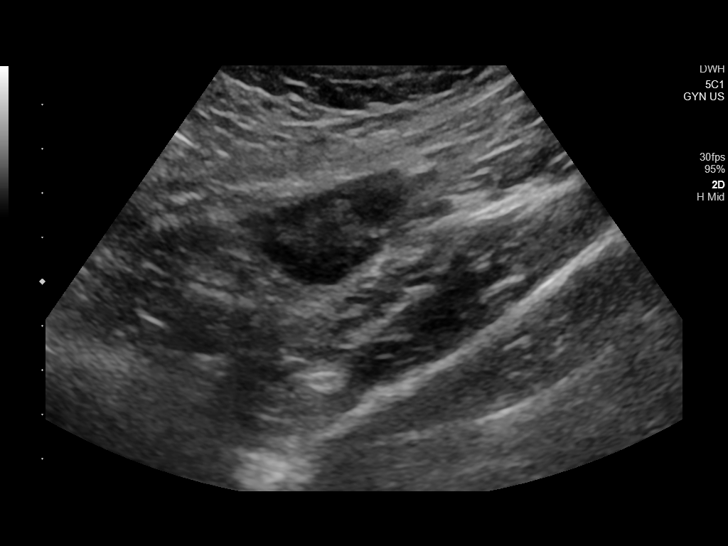
[im 53/115]
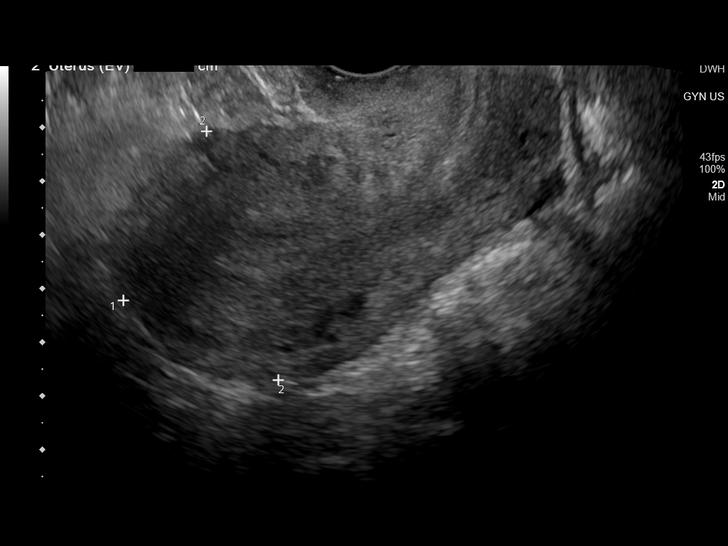
[im 62/115]
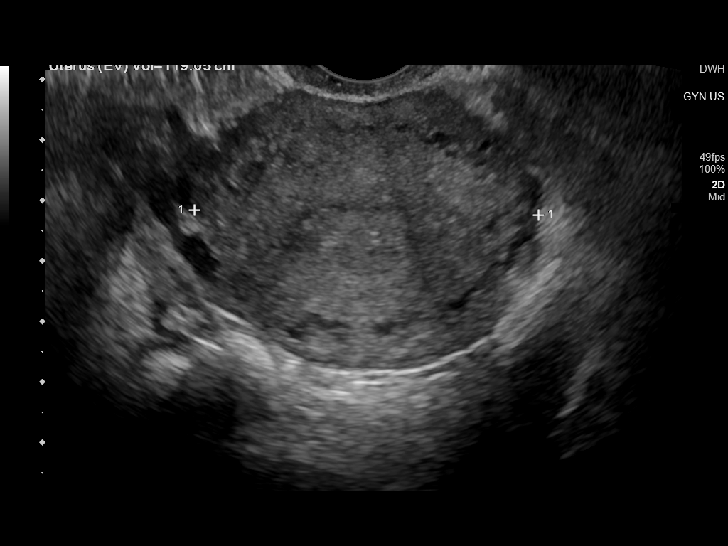
[im 72/115]
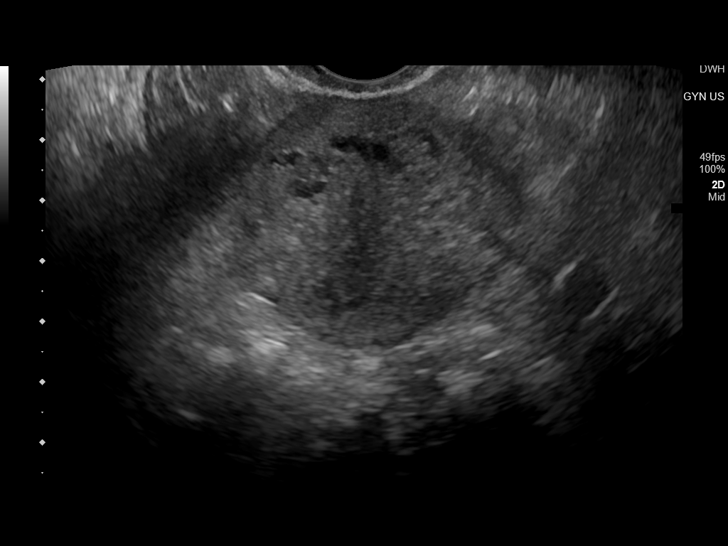
[im 77/115]
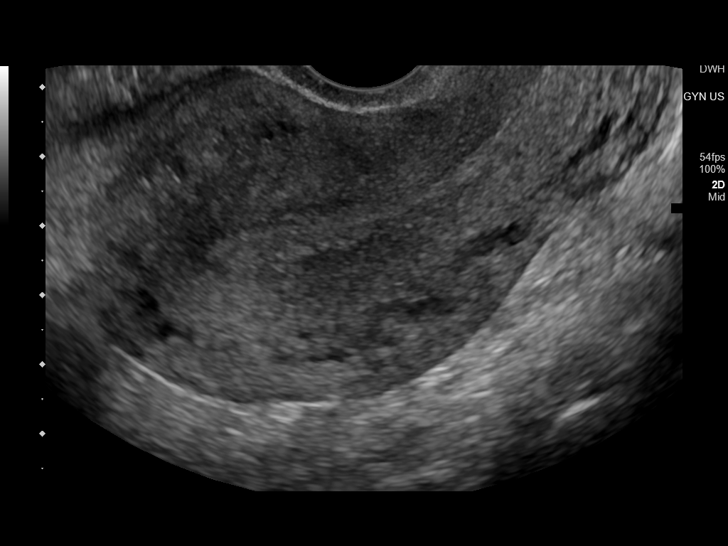
[im 86/115]
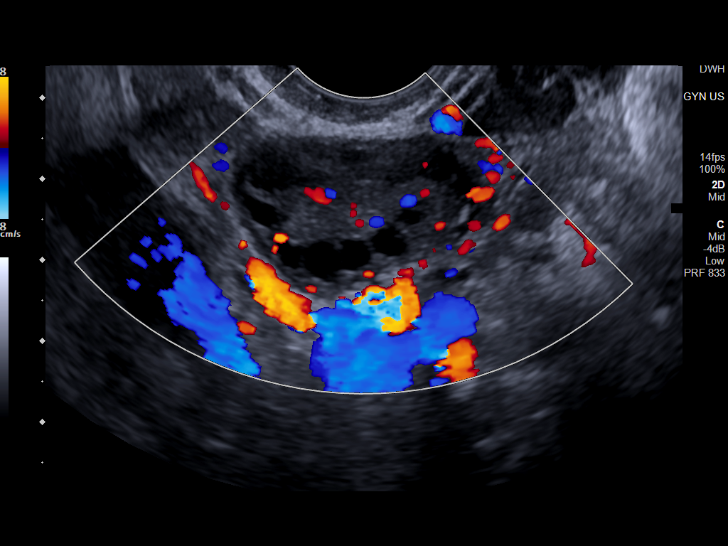
[im 96/115]
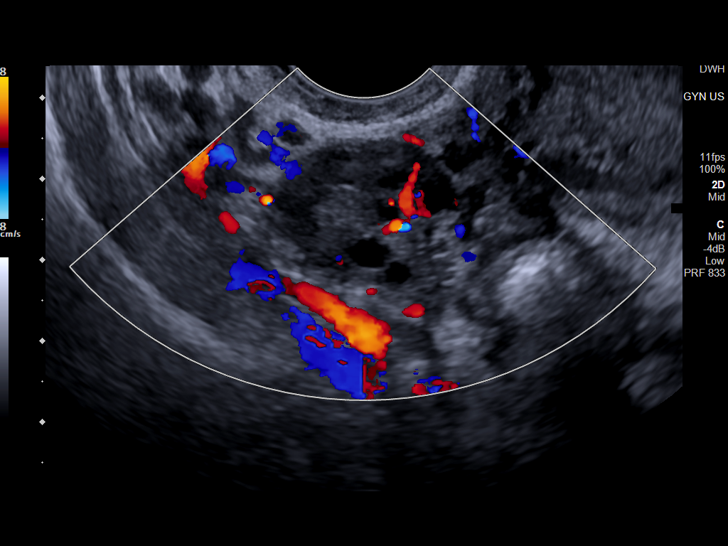
[im 105/115]
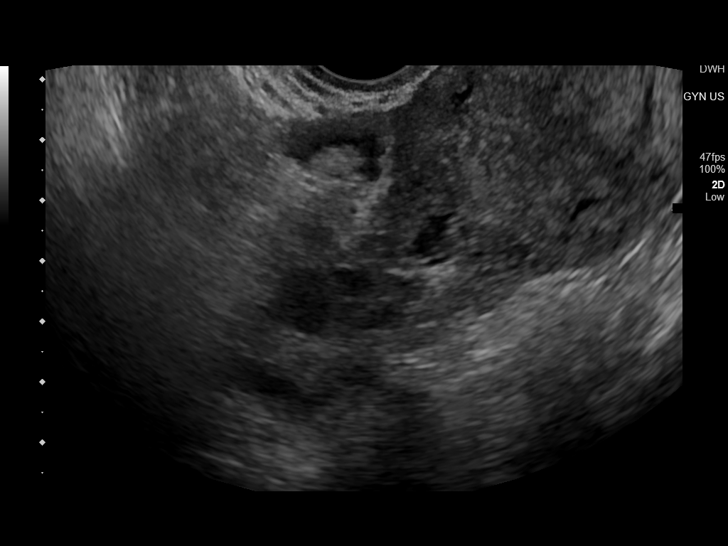
[im 115/115]
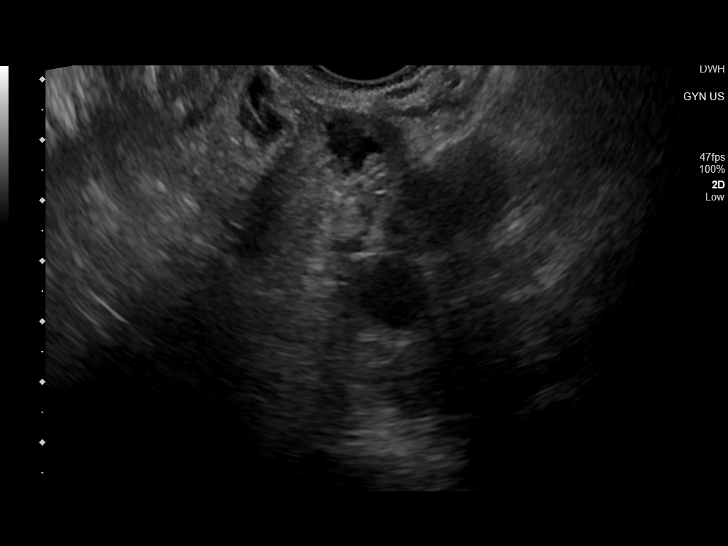

[14 of 25 positions shown; findings below may reference images not displayed]

FINDINGS: Uterus

Measurements: 8.3 x 4.8 x 5.7 cm = volume: 119.1 mL. No fibroids or
other mass visualized.

Endometrium

Thickness: 5.8 mm.  No focal abnormality visualized.

Right ovary

Measurements: 3.1 x 2.1 x 2.6 cm = volume: 8.8 mL. Normal
appearance/no adnexal mass.

Left ovary

Measurements: 3.5 x 2.8 x 1.9 cm = volume: 10.0 mL. Normal
appearance/no adnexal mass.

Other findings

Trace free fluid.
IMPRESSION: Trace free fluid.  Otherwise negative pelvic ultrasound

## 2021-09-13 ENCOUNTER — Ambulatory Visit: Payer: Medicaid Other | Admitting: Internal Medicine

## 2021-09-13 NOTE — Progress Notes (Deleted)
HPI  Patient presents to clinic today to establish care.  Past Medical History:  Diagnosis Date   Fibrocystic breast changes     Current Outpatient Medications  Medication Sig Dispense Refill   etonogestrel-ethinyl estradiol (NUVARING) 0.12-0.015 MG/24HR vaginal ring Insert vaginally and leave in place for 3 consecutive weeks, then remove for 1 week. 3 each 3   No current facility-administered medications for this visit.   Facility-Administered Medications Ordered in Other Visits  Medication Dose Route Frequency Provider Last Rate Last Admin   bupivacaine (PF) (MARCAINE) 0.25 % injection    Anesthesia Intra-op Yves Dill, MD   10 mL at 07/04/14 0720   lidocaine-EPINEPHrine 1.5 %-1:200000 injection    Anesthesia Intra-op Yves Dill, MD   4 mL at 07/04/14 0715    Allergies  Allergen Reactions   Penicillins Hives    Family History  Problem Relation Age of Onset   Diabetes Mother        DM Type 2   Breast cancer Neg Hx    Ovarian cancer Neg Hx     Social History   Socioeconomic History   Marital status: Single    Spouse name: Not on file   Number of children: Not on file   Years of education: Not on file   Highest education level: Not on file  Occupational History   Not on file  Tobacco Use   Smoking status: Never   Smokeless tobacco: Never  Vaping Use   Vaping Use: Never used  Substance and Sexual Activity   Alcohol use: No   Drug use: No   Sexual activity: Yes    Birth control/protection: Condom  Other Topics Concern   Not on file  Social History Narrative   Not on file   Social Determinants of Health   Financial Resource Strain: Not on file  Food Insecurity: Not on file  Transportation Needs: Not on file  Physical Activity: Not on file  Stress: Not on file  Social Connections: Not on file  Intimate Partner Violence: Not on file    ROS:  Constitutional: Denies fever, malaise, fatigue, headache or abrupt weight changes.  HEENT: Denies eye  pain, eye redness, ear pain, ringing in the ears, wax buildup, runny nose, nasal congestion, bloody nose, or sore throat. Respiratory: Denies difficulty breathing, shortness of breath, cough or sputum production.   Cardiovascular: Denies chest pain, chest tightness, palpitations or swelling in the hands or feet.  Gastrointestinal: Denies abdominal pain, bloating, constipation, diarrhea or blood in the stool.  GU: Denies frequency, urgency, pain with urination, blood in urine, odor or discharge. Musculoskeletal: Denies decrease in range of motion, difficulty with gait, muscle pain or joint pain and swelling.  Skin: Denies redness, rashes, lesions or ulcercations.  Neurological: Denies dizziness, difficulty with memory, difficulty with speech or problems with balance and coordination.  Psych: Denies anxiety, depression, SI/HI.  No other specific complaints in a complete review of systems (except as listed in HPI above).  PE:  There were no vitals taken for this visit. Wt Readings from Last 3 Encounters:  09/22/19 145 lb (65.8 kg)  11/20/17 138 lb (62.6 kg)  08/31/17 135 lb (61.2 kg)    General: Appears their stated age, well developed, well nourished in NAD. HEENT: Head: normal shape and size; Eyes: sclera white, no icterus, conjunctiva pink, PERRLA and EOMs intact; Ears: Tm's gray and intact, normal light reflex;Throat/Mouth: Teeth present, mucosa pink and moist, no lesions or ulcerations noted.  Neck: Neck supple, trachea  midline. No masses, lumps or thyromegaly present.  Cardiovascular: Normal rate and rhythm. S1,S2 noted.  No murmur, rubs or gallops noted. No JVD or BLE edema. No carotid bruits noted. Pulmonary/Chest: Normal effort and positive vesicular breath sounds. No respiratory distress. No wheezes, rales or ronchi noted.  Abdomen: Soft and nontender. Normal bowel sounds, no bruits noted. No distention or masses noted. Liver, spleen and kidneys non palpable. Musculoskeletal: Normal  range of motion. Strength 5/5 BUE/BLE. No signs of joint swelling. No difficulty with gait.  Neurological: Alert and oriented. Cranial nerves II-XII grossly intact. Coordination normal.  Psychiatric: Mood and affect normal. Behavior is normal. Judgment and thought content normal.   EKG:  BMET    Component Value Date/Time   NA 139 09/22/2019 2009   K 3.4 (L) 09/22/2019 2009   CL 108 09/22/2019 2009   CO2 24 09/22/2019 2009   GLUCOSE 93 09/22/2019 2009   BUN 9 09/22/2019 2009   CREATININE 0.71 09/22/2019 2009   CALCIUM 8.6 (L) 09/22/2019 2009   GFRNONAA >60 09/22/2019 2009   GFRAA >60 09/22/2019 2009    Lipid Panel  No results found for: "CHOL", "TRIG", "HDL", "CHOLHDL", "VLDL", "LDLCALC"  CBC    Component Value Date/Time   WBC 7.4 09/22/2019 2009   RBC 4.10 09/22/2019 2009   HGB 12.4 09/22/2019 2009   HGB 13.4 12/17/2011 1050   HCT 38.1 09/22/2019 2009   HCT 29.9 (L) 12/19/2011 0448   PLT 153 09/22/2019 2009   PLT 140 (L) 12/17/2011 1050   MCV 92.9 09/22/2019 2009   MCV 95 12/17/2011 1050   MCH 30.2 09/22/2019 2009   MCHC 32.5 09/22/2019 2009   RDW 13.7 09/22/2019 2009   RDW 12.6 12/17/2011 1050   LYMPHSABS 1.7 12/17/2011 1050   MONOABS 0.7 12/17/2011 1050   EOSABS 0.0 12/17/2011 1050   BASOSABS 0.0 12/17/2011 1050    Hgb A1C No results found for: "HGBA1C"   Assessment and Plan:   Nicki Reaper, NP

## 2022-01-09 ENCOUNTER — Ambulatory Visit: Payer: Medicaid Other | Admitting: Family Medicine
